# Patient Record
Sex: Female | Born: 1960 | Race: White | Hispanic: No | Marital: Married | Smoking: Former smoker
Health system: Southern US, Community
[De-identification: ages and names within clinical notes are randomized; demographics above are authoritative.]

## PROBLEM LIST (undated history)

## (undated) DIAGNOSIS — K8020 Calculus of gallbladder without cholecystitis without obstruction: Secondary | ICD-10-CM

## (undated) DIAGNOSIS — E7211 Homocystinuria: Secondary | ICD-10-CM

## (undated) DIAGNOSIS — E785 Hyperlipidemia, unspecified: Secondary | ICD-10-CM

## (undated) DIAGNOSIS — R7989 Other specified abnormal findings of blood chemistry: Secondary | ICD-10-CM

## (undated) DIAGNOSIS — M1990 Unspecified osteoarthritis, unspecified site: Secondary | ICD-10-CM

## (undated) DIAGNOSIS — Z87442 Personal history of urinary calculi: Secondary | ICD-10-CM

## (undated) DIAGNOSIS — N209 Urinary calculus, unspecified: Secondary | ICD-10-CM

## (undated) DIAGNOSIS — F419 Anxiety disorder, unspecified: Secondary | ICD-10-CM

## (undated) HISTORY — PX: DILATION AND CURETTAGE OF UTERUS: SHX78

## (undated) HISTORY — DX: Other specified abnormal findings of blood chemistry: R79.89

## (undated) HISTORY — DX: Urinary calculus, unspecified: N20.9

## (undated) HISTORY — PX: APPENDECTOMY: SHX54

## (undated) HISTORY — PX: EXTRACORPOREAL SHOCK WAVE LITHOTRIPSY: SHX1557

## (undated) HISTORY — PX: PELVIC LAPAROSCOPY: SHX162

## (undated) HISTORY — DX: Anxiety disorder, unspecified: F41.9

## (undated) HISTORY — DX: Hyperlipidemia, unspecified: E78.5

## (undated) HISTORY — DX: Homocystinuria: E72.11

---

## 1998-06-01 ENCOUNTER — Other Ambulatory Visit: Admission: RE | Admit: 1998-06-01 | Discharge: 1998-06-01 | Payer: Self-pay | Admitting: Obstetrics and Gynecology

## 1999-03-24 ENCOUNTER — Encounter: Payer: Self-pay | Admitting: Urology

## 1999-03-24 ENCOUNTER — Ambulatory Visit (HOSPITAL_COMMUNITY): Admission: RE | Admit: 1999-03-24 | Discharge: 1999-03-24 | Payer: Self-pay | Admitting: Urology

## 1999-06-06 ENCOUNTER — Other Ambulatory Visit: Admission: RE | Admit: 1999-06-06 | Discharge: 1999-06-06 | Payer: Self-pay | Admitting: Obstetrics and Gynecology

## 2000-07-09 ENCOUNTER — Other Ambulatory Visit: Admission: RE | Admit: 2000-07-09 | Discharge: 2000-07-09 | Payer: Self-pay | Admitting: Obstetrics and Gynecology

## 2001-08-12 ENCOUNTER — Other Ambulatory Visit: Admission: RE | Admit: 2001-08-12 | Discharge: 2001-08-12 | Payer: Self-pay | Admitting: Obstetrics and Gynecology

## 2002-09-08 ENCOUNTER — Other Ambulatory Visit: Admission: RE | Admit: 2002-09-08 | Discharge: 2002-09-08 | Payer: Self-pay | Admitting: Obstetrics and Gynecology

## 2003-09-11 ENCOUNTER — Other Ambulatory Visit: Admission: RE | Admit: 2003-09-11 | Discharge: 2003-09-11 | Payer: Self-pay | Admitting: Obstetrics and Gynecology

## 2004-03-14 ENCOUNTER — Ambulatory Visit: Payer: Self-pay | Admitting: Family Medicine

## 2004-09-27 ENCOUNTER — Ambulatory Visit: Payer: Self-pay | Admitting: Internal Medicine

## 2004-09-30 ENCOUNTER — Encounter: Admission: RE | Admit: 2004-09-30 | Discharge: 2004-09-30 | Payer: Self-pay | Admitting: Internal Medicine

## 2004-10-19 ENCOUNTER — Ambulatory Visit: Payer: Self-pay | Admitting: Internal Medicine

## 2004-10-21 ENCOUNTER — Encounter: Admission: RE | Admit: 2004-10-21 | Discharge: 2004-10-21 | Payer: Self-pay | Admitting: Internal Medicine

## 2004-12-12 ENCOUNTER — Ambulatory Visit: Payer: Self-pay | Admitting: Internal Medicine

## 2004-12-14 ENCOUNTER — Other Ambulatory Visit: Admission: RE | Admit: 2004-12-14 | Discharge: 2004-12-14 | Payer: Self-pay | Admitting: Obstetrics and Gynecology

## 2004-12-23 ENCOUNTER — Encounter: Admission: RE | Admit: 2004-12-23 | Discharge: 2004-12-23 | Payer: Self-pay | Admitting: Obstetrics and Gynecology

## 2004-12-28 ENCOUNTER — Encounter: Admission: RE | Admit: 2004-12-28 | Discharge: 2005-03-28 | Payer: Self-pay | Admitting: Internal Medicine

## 2005-02-01 ENCOUNTER — Ambulatory Visit: Payer: Self-pay | Admitting: Internal Medicine

## 2007-03-19 ENCOUNTER — Ambulatory Visit: Payer: Self-pay | Admitting: Internal Medicine

## 2007-03-22 ENCOUNTER — Telehealth: Payer: Self-pay | Admitting: Internal Medicine

## 2008-04-29 ENCOUNTER — Ambulatory Visit: Payer: Self-pay | Admitting: Internal Medicine

## 2008-04-29 ENCOUNTER — Encounter (INDEPENDENT_AMBULATORY_CARE_PROVIDER_SITE_OTHER): Payer: Self-pay | Admitting: *Deleted

## 2008-04-29 DIAGNOSIS — E7210 Disorders of sulfur-bearing amino-acid metabolism, unspecified: Secondary | ICD-10-CM | POA: Insufficient documentation

## 2008-05-04 ENCOUNTER — Telehealth (INDEPENDENT_AMBULATORY_CARE_PROVIDER_SITE_OTHER): Payer: Self-pay | Admitting: *Deleted

## 2008-05-14 ENCOUNTER — Ambulatory Visit: Payer: Self-pay | Admitting: Internal Medicine

## 2008-05-20 ENCOUNTER — Encounter (INDEPENDENT_AMBULATORY_CARE_PROVIDER_SITE_OTHER): Payer: Self-pay | Admitting: *Deleted

## 2008-06-03 ENCOUNTER — Telehealth (INDEPENDENT_AMBULATORY_CARE_PROVIDER_SITE_OTHER): Payer: Self-pay | Admitting: *Deleted

## 2008-11-10 ENCOUNTER — Ambulatory Visit: Payer: Self-pay | Admitting: Gastroenterology

## 2008-12-01 ENCOUNTER — Ambulatory Visit: Payer: Self-pay | Admitting: Gastroenterology

## 2009-11-19 ENCOUNTER — Ambulatory Visit: Payer: Self-pay | Admitting: Internal Medicine

## 2009-11-19 ENCOUNTER — Encounter: Payer: Self-pay | Admitting: Internal Medicine

## 2009-11-19 DIAGNOSIS — F411 Generalized anxiety disorder: Secondary | ICD-10-CM | POA: Insufficient documentation

## 2009-11-19 DIAGNOSIS — E785 Hyperlipidemia, unspecified: Secondary | ICD-10-CM

## 2009-11-25 LAB — CONVERTED CEMR LAB
ALT: 14 units/L (ref 0–35)
Alkaline Phosphatase: 67 units/L (ref 39–117)
BUN: 13 mg/dL (ref 6–23)
Basophils Absolute: 0 10*3/uL (ref 0.0–0.1)
Basophils Relative: 0.7 % (ref 0.0–3.0)
Bilirubin, Direct: 0.1 mg/dL (ref 0.0–0.3)
Cholesterol: 193 mg/dL (ref 0–200)
Creatinine, Ser: 0.8 mg/dL (ref 0.4–1.2)
Eosinophils Absolute: 0.1 10*3/uL (ref 0.0–0.7)
Eosinophils Relative: 1.6 % (ref 0.0–5.0)
HDL: 64.9 mg/dL (ref >39.00)
Hemoglobin: 13.9 g/dL (ref 12.0–15.0)
LDL Cholesterol: 117 mg/dL — ABNORMAL HIGH (ref 0–99)
Lymphs Abs: 1.9 10*3/uL (ref 0.7–4.0)
MCHC: 34.4 g/dL (ref 30.0–36.0)
Monocytes Absolute: 0.3 10*3/uL (ref 0.1–1.0)
Neutro Abs: 3.8 10*3/uL (ref 1.4–7.7)
RBC: 4.43 M/uL (ref 3.87–5.11)
RDW: 12.6 % (ref 11.5–14.6)
Total CHOL/HDL Ratio: 3
Total Protein: 7.3 g/dL (ref 6.0–8.3)

## 2009-12-15 ENCOUNTER — Encounter: Payer: Self-pay | Admitting: Internal Medicine

## 2009-12-15 ENCOUNTER — Ambulatory Visit: Payer: Self-pay | Admitting: Internal Medicine

## 2010-01-21 ENCOUNTER — Other Ambulatory Visit: Payer: Self-pay | Admitting: Internal Medicine

## 2010-01-21 ENCOUNTER — Ambulatory Visit
Admission: RE | Admit: 2010-01-21 | Discharge: 2010-01-21 | Payer: Self-pay | Source: Home / Self Care | Attending: Internal Medicine | Admitting: Internal Medicine

## 2010-01-21 LAB — ALT: ALT: 19 U/L (ref 0–35)

## 2010-01-21 LAB — AST: AST: 20 U/L (ref 0–37)

## 2010-01-21 LAB — LIPID PANEL
Cholesterol: 170 mg/dL (ref 0–200)
HDL: 72.2 mg/dL (ref >39.00)
LDL Cholesterol: 90 mg/dL (ref 0–99)
Total CHOL/HDL Ratio: 2
Triglycerides: 37 mg/dL (ref 0.0–149.0)
VLDL: 7.4 mg/dL (ref 0.0–40.0)

## 2010-02-13 LAB — CONVERTED CEMR LAB
ALT: 18 U/L
AST: 22 U/L
BUN: 11 mg/dL
Basophils Absolute: 0 K/uL
Basophils Relative: 0.1 %
CO2: 32 meq/L
Calcium: 9.2 mg/dL
Chloride: 101 meq/L
Cholesterol: 257 mg/dL — ABNORMAL HIGH
Creatinine, Ser: 0.7 mg/dL
Direct LDL: 174.5 mg/dL
Eosinophils Absolute: 0.2 K/uL
Eosinophils Relative: 2.2 %
GFR calc non Af Amer: 94.28 mL/min
Glucose, Bld: 77 mg/dL
HCT: 40.6 %
HDL: 74.3 mg/dL
Hemoglobin: 14 g/dL
Lymphocytes Relative: 30.3 %
Lymphs Abs: 2.5 K/uL
MCHC: 34.3 g/dL
MCV: 90.5 fL
Monocytes Absolute: 0.5 K/uL
Monocytes Relative: 6.4 %
Neutro Abs: 5 K/uL
Neutrophils Relative %: 61 %
Pap Smear: NORMAL
Platelets: 243 K/uL
Potassium: 3.8 meq/L
RBC: 4.49 M/uL
RDW: 12 %
Sodium: 140 meq/L
TSH: 1.19 u[IU]/mL
Total CHOL/HDL Ratio: 3
Triglycerides: 70 mg/dL
VLDL: 14 mg/dL
WBC: 8.2 10*3/microliter

## 2010-02-15 NOTE — Assessment & Plan Note (Signed)
Summary: CPX//PH   Vital Signs:  Patient profile:   50 year old female Height:      63.5 inches Weight:      155.25 pounds BMI:     27.17 Pulse rate:   80 / minute Pulse rhythm:   regular BP sitting:   130 / 82  (left arm) Cuff size:   regular  Vitals Entered By: Army Fossa CMA (November 19, 2009 10:43 AM) CC: CPX,fasting Comments c/o anxiety recently [facility] [address]   History of Present Illness:  CPX,fasting she also has the following issues  5 weeks ago she found out about some family problems, very anxious, depressed and unable to sleep since then. At the present time, actually the depression is resolved, she is a still a little anxious and would like something to help her sleep  She also developed chest pain a few weeks ago, on and off, described as pressure in the middle of the chest without radiation, symptoms   last about an hour. No associated nausea or diaphoresis She thinks is  stress, after she had a long conversation with a friend and felt better emotionally, the chest pain has decreased   We prescribe simvastatin for her, she took it for a few days and she just did not like it. Denies myalgias per se    Current Medications (verified): 1)  Prempro 0.45-1.5 Mg  Tabs (Conj Estrog-Medroxyprogest Ace) .Marland Kitchen.. 1qd 2)  Aspir-Low 81 Mg Tbec (Aspirin) .... Once Daily  Allergies (verified): No Known Drug Allergies  Past History:  Past Medical History: increased homocysteine h/o urolithiasis BC--husband's vasectomy, also on HRT since 2008 strong family history heart disease Hyperlipidemia Anxiety  Past Surgical History: Reviewed history from 03/19/2007 and no changes required. Appendectomy D&C pelvic lap (to r/o endometriosis)  Family History: colon CA--GF, Dx age 41  pre cancerous polyps, mom dx age 6 Breast CA--mother (in her 28s) CAD--F  ahd a CABG at age  22; then a redo CABG years later . Sibling-44y/o CABG (he had increase cholesterol,former  smoker). 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 Resp:  Denies cough and wheezing. GI:  Denies bloody stools, diarrhea, nausea, and vomiting. GU:  Denies dysuria, hematuria, and urinary hesitancy. Neuro:  some HAs .  Physical Exam  General:  alert, well-developed, and well-nourished.   Neck:  no thyromegaly.  normal carotid pulses Lungs:  normal respiratory effort, no intercostal retractions, no accessory muscle use, and normal breath sounds.   Heart:  normal rate, regular rhythm, and no murmur.   Abdomen:  soft, non-tender, no distention, no masses, no guarding, and no rigidity.   Extremities:  no lower extremity edema Neurologic:  alert & oriented X3, strength normal in all extremities, and gait normal.   Psych:  Oriented X3, memory intact for recent and remote, normally interactive, good eye contact, and not depressed appearing.  slightly anxious   Impression & Recommendations:  Problem # 1:  HEALTH SCREENING (ICD-V70.0) Td 2006 flu shot  strong FH of CAD ----> see next   cont.  ASA   female care per gynecology  also + FH of colon cancer in a GF  (Dx on his 4s) ----> Cscope normal except for tics,  11-10----->  next 2020 diet and exercise discussed    Orders: Venipuncture (91478) TLB-BMP (Basic Metabolic Panel-BMET) (80048-METABOL) TLB-CBC Platelet - w/Differential (85025-CBCD) TLB-Hepatic/Liver Function Pnl (80076-HEPATIC) TLB-Lipid Panel (80061-LIPID) TLB-TSH (Thyroid Stimulating Hormone) (84443-TSH) TLB-ALT (SGPT) (84460-ALT) TLB-AST (SGOT) (84450-SGOT) Specimen Handling (29562)  Problem # 2:  HYPERLIPIDEMIA (ICD-272.4) based on  her last FLP, she was recommended simvastatin she took it for a few days and  "didn't like it", no myalgias per se Labs Lipitor?  The following medications were removed from the medication list:    Simvastatin 40 Mg Tabs (Simvastatin) .Marland Kitchen... 1 by mouth at bedtime  Labs Reviewed: SGOT: 22 (04/29/2008)   SGPT: 18 (04/29/2008)   HDL:74.30 (04/29/2008)  Chol:257 (04/29/2008)  Trig:70.0 (04/29/2008)  Problem # 3:  ANXIETY (ICD-300.00) counseled , we agreed for now to take something to help her sleep. She will call if feels she needs something else  Her updated medication list for this problem includes:    Alprazolam 0.5 Mg Tabs (Alprazolam) .Marland Kitchen... 1 or 2 by mouth at bedtime as needed for insomnia  Problem # 4:  CHEST PAIN (ICD-786.50) chest pain in a 50 year old pt  with strong family history of heart disease. EKG normal sinus rhythm This is happening in the setting of recent anxiety however I am recommending a stress test. she agreed   Orders: EKG w/ Interpretation (93000) Cardiology Referral (Cardiology)  Complete Medication List: 1)  Prempro 0.45-1.5 Mg Tabs (Conj estrog-medroxyprogest ace) .Marland Kitchen.. 1qd 2)  Aspir-low 81 Mg Tbec (Aspirin) .... Once daily 3)  Alprazolam 0.5 Mg Tabs (Alprazolam) .Marland Kitchen.. 1 or 2 by mouth at bedtime as needed for insomnia  Other Orders: Admin 1st Vaccine (13086) Flu Vaccine 91yrs + (57846) T- * Misc. Laboratory test [phone])  Patient Instructions: 1)  Please schedule a follow-up appointment in 2 months.  Prescriptions: ALPRAZOLAM 0.5 MG TABS (ALPRAZOLAM) 1 or 2 by mouth at bedtime as needed for insomnia  #40 x 0   Entered and Authorized by:   Nolon Rod. Paz MD   Signed by:   Nolon Rod. Paz MD on 11/19/2009   Method used:   Print then Give to Patient   RxID:   [id number]    Orders Added: 1)  Admin 1st Vaccine [90471] 2)  Flu Vaccine 68yrs + [66440] 3)  Venipuncture [34742] 4)  TLB-BMP (Basic Metabolic Panel-BMET) [80048-METABOL] 5)  TLB-CBC Platelet - w/Differential [85025-CBCD] 6)  TLB-Hepatic/Liver Function Pnl  [80076-HEPATIC] 7)  TLB-Lipid Panel [80061-LIPID] 8)  TLB-TSH (Thyroid Stimulating Hormone) [84443-TSH] 9)  TLB-ALT (SGPT) [84460-ALT] 10)  TLB-AST (SGOT) [84450-SGOT] 11)  T- * Misc. Laboratory test [id number] 12)  Specimen Handling [99000] 13)  EKG w/ Interpretation [93000] 14)  Est. Patient Level III [87564] 15)  Cardiology Referral [Cardiology] 16)  Est. Patient age 56-64 [55]     Preventive Care Screening  Mammogram:    Date:  11/05/2009    Results:  normal-per pt   Pap Smear:    Date:  11/05/2009    Results:  normal- per pt  Flu Vaccine Consent Questions     Do you have a history of severe allergic reactions to this vaccine? no    Any prior history of allergic reactions to egg and/or gelatin? no    Do you have a sensitivity to the preservative Thimersol? no    Do you have a past history of Guillan-Barre Syndrome? no    Do you currently have an acute febrile illness? no    Have you ever had a  severe reaction to latex? no    Vaccine information given and explained to patient? yes    Are you currently pregnant? no    Lot Number:[id number]   Exp Date:07/16/2010   Site Given  Left Deltoid IM  Mammogram:    Date:  11/05/2009    Results:  normal-per pt   Pap Smear:    Date:  11/05/2009    Results:  normal- per pt   .lbflu   Risk Factors:  Mammogram History:     Date of Last Mammogram:  11/05/2009    Results:  normal-per pt   PAP Smear History:     Date of Last PAP Smear:  11/05/2009    Results:  normal- per pt

## 2010-02-17 NOTE — Assessment & Plan Note (Signed)
Summary: rto 2 months/cbs   Vital Signs:  Patient profile:   50 year old female Weight:      154.38 pounds Pulse rate:   64 / minute Pulse rhythm:   regular BP sitting:   118 / 78  (left arm) Cuff size:   regular  Vitals Entered By: Army Fossa CMA (January 21, 2010 1:51 PM) CC: 6 week f/u- fasting  Comments [facility] [place]    History of Present Illness: here for followup Based on the last cholesterol panel, she started Lipitor +  compliance, no side effects.  She also had chest pain, had a stress test  Review of systems  denies myalgias No further chest pain No nausea or vomiting She continued to exercise routinely Her diet was no hostility during the holidays but she's getting back on track  Current Medications (verified): 1)  Prempro 0.45-1.5 Mg  Tabs (Conj Estrog-Medroxyprogest Ace) .Marland Kitchen.. 1qd 2)  Aspir-Low 81 Mg Tbec (Aspirin) .... Once Daily 3)  Lipitor 10 Mg Tabs (Atorvastatin Calcium) .Marland Kitchen.. 1 By Mouth Daily. -Labwork in 6 Weeks.  Allergies (verified): No Known Drug Allergies  Past History:  Past Medical History: Reviewed history from 11/19/2009 and no changes required. increased homocysteine h/o urolithiasis BC--husband's vasectomy, also on HRT since 2008 strong family history heart disease Hyperlipidemia Anxiety  Past Surgical History: Reviewed history from 03/19/2007 and no changes required. Appendectomy D&C pelvic lap (to r/o endometriosis)  Social History: Reviewed history from 11/19/2009 and no changes required. 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   Lungs:  normal respiratory effort, no intercostal retractions, no accessory muscle use, and normal breath sounds.   Heart:  normal rate, regular rhythm, and no murmur.     Impression & Recommendations:  Problem # 1:   HYPERLIPIDEMIA (ICD-272.4) all  previous cholesterol panels reviewed with the patient. The rationale behind treating her lipids is  discussed Plan: Labs Continue Lipitor see  instructions Her updated medication list for this problem includes:    Lipitor 10 Mg Tabs (Atorvastatin calcium) .Marland Kitchen... 1 by mouth daily. -labwork in 6 weeks.  Labs Reviewed: SGOT: 17 (11/19/2009)   SGPT: 14 (11/19/2009)  Prior 10 Yr Risk Heart Disease: 2 % (12/15/2009)   HDL:64.90 (11/19/2009), 74.30 (04/29/2008)  LDL:117 (11/19/2009)  Chol:193 (11/19/2009), 257 (04/29/2008)  Trig:57.0 (11/19/2009), 70.0 (04/29/2008)  Orders: Venipuncture (47829) TLB-Lipid Panel (80061-LIPID) TLB-ALT (SGPT) (84460-ALT) TLB-AST (SGOT) (84450-SGOT) Specimen Handling (56213)  Problem # 2:  CHEST PAIN (ICD-786.50) resolved  Complete Medication List: 1)  Prempro 0.45-1.5 Mg Tabs (Conj estrog-medroxyprogest ace) .Marland Kitchen.. 1qd 2)  Aspir-low 81 Mg Tbec (Aspirin) .... Once daily 3)  Lipitor 10 Mg Tabs (Atorvastatin calcium) .Marland Kitchen.. 1 by mouth daily. -labwork in 6 weeks.  Patient Instructions: 1)  came back by 06-2010 for labs only: FLP AST ALT---- dx high cholesterol 2)  schedule a physical exam by after 11-20-10 Prescriptions: LIPITOR 10 MG TABS (ATORVASTATIN CALCIUM) 1 by mouth daily. -LABWORK IN 6 WEEKS.  #90 x 4   Entered and Authorized by:   Nolon Rod. Kuper Rennels MD   Signed by:   Nolon Rod. Robley Matassa MD on 01/21/2010   Method used:   Print then Give to Patient   RxID:   [id number]    Orders Added: 1)  Venipuncture [13244] 2)  TLB-Lipid Panel [80061-LIPID] 3)  TLB-ALT (SGPT) [84460-ALT] 4)  TLB-AST (SGOT) [84450-SGOT] 5)  Specimen Handling [99000] 6)  Est. Patient Level III [54098]

## 2010-08-24 ENCOUNTER — Ambulatory Visit: Payer: Self-pay | Admitting: Internal Medicine

## 2010-09-21 ENCOUNTER — Encounter: Payer: Self-pay | Admitting: Internal Medicine

## 2010-09-22 ENCOUNTER — Ambulatory Visit (INDEPENDENT_AMBULATORY_CARE_PROVIDER_SITE_OTHER): Payer: Managed Care, Other (non HMO) | Admitting: Internal Medicine

## 2010-09-22 ENCOUNTER — Encounter: Payer: Self-pay | Admitting: Internal Medicine

## 2010-09-22 DIAGNOSIS — E785 Hyperlipidemia, unspecified: Secondary | ICD-10-CM

## 2010-09-22 DIAGNOSIS — E7210 Disorders of sulfur-bearing amino-acid metabolism, unspecified: Secondary | ICD-10-CM

## 2010-09-22 DIAGNOSIS — D689 Coagulation defect, unspecified: Secondary | ICD-10-CM | POA: Insufficient documentation

## 2010-09-22 DIAGNOSIS — F411 Generalized anxiety disorder: Secondary | ICD-10-CM

## 2010-09-22 LAB — LIPID PANEL
HDL: 73 mg/dL (ref >39.00)
LDL Cholesterol: 87 mg/dL (ref 0–99)
Total CHOL/HDL Ratio: 2
Triglycerides: 78 mg/dL (ref 0.0–149.0)
VLDL: 15.6 mg/dL (ref 0.0–40.0)

## 2010-09-22 NOTE — Assessment & Plan Note (Signed)
Last level ok On a [facility] Labs

## 2010-09-22 NOTE — Assessment & Plan Note (Signed)
Still anxious, some depression Counseled States does not need meds at this time  Rarely takes [place] (started by another MD)

## 2010-09-22 NOTE — Assessment & Plan Note (Addendum)
+   FH of a coagulopathy, the patient has never had clot but she had a miscarriage.  pt needs further testing: refer to hematology

## 2010-09-22 NOTE — Assessment & Plan Note (Addendum)
See HPI, fatigue related to lipitor? There is other issues that may be causing fatigue thus will observe. rec to call if the improving does not continue, will change statin Labs diet-exercise encouraged. Addendum: She likes to go off Lipitor and see how she does. I recommended to hold Lipitor for 3 weeks then go back to it.

## 2010-09-22 NOTE — Progress Notes (Signed)
  Subjective:    Patient ID: Lynn Odonnell, female    DOB: [birth year], 50 y.o.   MRN: [id number]  HPI Coagulopathy? 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 Patient was recommended  to be further tested. Also reports that her mother had a miscarriage  Hyperlipidemia, good medication compliance. If the patient observation that she feels weaker and fatigued since she started taking Lipitor, she has not been exercising for months. She's not sure if weakness is due to Lipitor or anxiety, see below. anxiety: A lot of stress in her life, some depression  Past Medical History  Diagnosis Date  . Increased homocysteine   . Urolithiasis     history  . Hyperlipidemia   . Anxiety    Past Surgical History  Procedure Date  . Appendectomy   . Dilation and curettage of uterus   . Pelvic laparoscopy     endometriosis    Review of Systems Occasionally insomnia, takes ambien from time to time. No suicidal. Denies any typical myalgias, she does have pain at the left heel from time to time. She involved a number of labs from her mother, they are reviewed, they include a protein C, protein,  PT PCR, PT nucleotide F2, several types of MTHFR     Objective:   Physical Exam  Constitutional: She appears well-developed and well-nourished.  HENT:  Head: Normocephalic and atraumatic.  Cardiovascular: Normal rate, regular rhythm and normal heart sounds.   Pulmonary/Chest: Effort normal and breath sounds normal. No respiratory distress. She has no wheezes. She has no rales.  Musculoskeletal: She exhibits no edema.  Psychiatric:       Slightly anxious, not depressed appearing          Assessment & Plan:  Today , I spent more than 25  min with the patient, >50% of the time counseling about anxiety and discussing her possible coagulopathy

## 2010-09-23 LAB — HOMOCYSTEINE: Homocysteine: 10 umol/L (ref 4.0–15.4)

## 2010-09-29 ENCOUNTER — Telehealth: Payer: Self-pay | Admitting: Internal Medicine

## 2010-09-29 NOTE — Telephone Encounter (Signed)
Patient called to check status of hematology referral from last week---says that her insurance web site shows doctors on [address] which is the closest location for her

## 2010-10-04 ENCOUNTER — Encounter (HOSPITAL_BASED_OUTPATIENT_CLINIC_OR_DEPARTMENT_OTHER): Payer: Managed Care, Other (non HMO) | Admitting: Hematology and Oncology

## 2010-10-04 ENCOUNTER — Other Ambulatory Visit: Payer: Self-pay | Admitting: Hematology and Oncology

## 2010-10-04 DIAGNOSIS — D689 Coagulation defect, unspecified: Secondary | ICD-10-CM

## 2010-10-05 ENCOUNTER — Encounter: Payer: Managed Care, Other (non HMO) | Admitting: Hematology and Oncology

## 2010-10-10 LAB — HYPERCOAGULABLE PANEL, COMPREHENSIVE
AntiThromb III Func: 98 % (ref 76–126)
Anticardiolipin IgA: 7 APL U/mL (ref <22)
Anticardiolipin IgG: 9 GPL U/mL (ref <23)
Anticardiolipin IgM: 4 MPL U/mL (ref <11)
Beta-2-Glycoprotein I IgA: 2 A Units (ref <20)
Beta-2-Glycoprotein I IgM: 8 M Units (ref <20)
PTT Lupus Anticoagulant: 30.9 secs (ref 30.0–45.6)
Protein C Activity: 169 % — ABNORMAL HIGH (ref 75–133)
Protein S Activity: 79 % (ref 69–129)

## 2010-10-12 NOTE — Telephone Encounter (Signed)
Patient was seen on 10-04-10 at the Hematologist.

## 2010-10-13 ENCOUNTER — Encounter (HOSPITAL_BASED_OUTPATIENT_CLINIC_OR_DEPARTMENT_OTHER): Payer: Managed Care, Other (non HMO) | Admitting: Hematology and Oncology

## 2010-10-13 DIAGNOSIS — D689 Coagulation defect, unspecified: Secondary | ICD-10-CM

## 2011-04-11 ENCOUNTER — Ambulatory Visit (INDEPENDENT_AMBULATORY_CARE_PROVIDER_SITE_OTHER): Payer: Managed Care, Other (non HMO) | Admitting: Internal Medicine

## 2011-04-11 VITALS — BP 116/82 | HR 103 | Temp 97.9°F | Wt 162.0 lb

## 2011-04-11 DIAGNOSIS — H6690 Otitis media, unspecified, unspecified ear: Secondary | ICD-10-CM

## 2011-04-11 DIAGNOSIS — J329 Chronic sinusitis, unspecified: Secondary | ICD-10-CM

## 2011-04-11 MED ORDER — AMOXICILLIN-POT CLAVULANATE 875-125 MG PO TABS: 1.0000 | ORAL_TABLET | Freq: Two times a day (BID) | ORAL | Status: AC

## 2011-04-11 MED ORDER — ZOLPIDEM TARTRATE 10 MG PO TABS: 10.0000 mg | ORAL_TABLET | Freq: Every evening | ORAL | Status: DC | PRN

## 2011-04-11 MED ORDER — HYDROCODONE-HOMATROPINE 5-1.5 MG/5ML PO SYRP: 5.0000 mL | ORAL_SOLUTION | Freq: Four times a day (QID) | ORAL | Status: AC | PRN

## 2011-04-11 MED ORDER — AZELASTINE HCL 0.1 % NA SOLN: 2.0000 | Freq: Two times a day (BID) | NASAL | Status: DC

## 2011-04-11 NOTE — Patient Instructions (Addendum)
Rest, fluids , tylenol For cough, take Mucinex DM twice a day as needed  If cough is severe, use hydrocodone, will cause drowsiness For congestion use astelin nasal spray until better Take the antibiotic as prescribed ----> Augmentiun Call if no better in few days Call anytime if the symptoms are severe, you have high fever, short of breath, persistent chest pain --- lipitor every day! Schedule a routine office visit once you are better

## 2011-04-11 NOTE — Progress Notes (Signed)
  Subjective:    Patient ID: Lynn Odonnell, female    DOB: 1960/09/15, 51 y.o.   MRN: [id number]  HPI Acute visit Symptoms started about 3 weeks ago when she was visiting [place], symptoms increased a few days after she came back to the [place]. She has head and chest congestion, sore throat, dry cough, green and yellow mucus discharge. Tried nyquil  Past Medical History  Diagnosis Date  . Increased homocysteine   . Urolithiasis     history  . Hyperlipidemia   . Anxiety       Review of Systems No fever chills some shortness of breath mostly from being unable to breathe through her nose She admits to a ill-defined anterior chest pressure on and off, no worse with cough, only for the last few days, no pain today     Objective:   Physical Exam  General -- alert, well-developed, and well-nourished. NAD  HEENT -- TM R normal, L is red, slt gulge, no d/c;  throat w/o redness, face symmetric and tender at the L maxillary area, nose quite congested Lungs -- normal respiratory effort, no intercostal retractions, no accessory muscle use, and few ronchi w/ cough.   Heart-- normal rate, regular rhythm, no murmur, and no gallop.    Extremities-- no pretibial edema bilaterally , calves symmetric and no tender        Assessment & Plan:   3 weeks' history of respiratory symptoms, findings consistent with sinusitis and left otitis media. I'm  somehow concerned about the chest discomfort because she has a family history of clots and just came back from [place] however in light of her  frequent cough, lack of lower extremity edema or shortness of breath,  I think is ok to treat her respiratory symptoms and observe the chest pain; further eval if no better

## 2011-04-12 ENCOUNTER — Encounter: Payer: Self-pay | Admitting: Internal Medicine

## 2011-08-01 ENCOUNTER — Other Ambulatory Visit: Payer: Self-pay | Admitting: Internal Medicine

## 2011-08-01 MED ORDER — ATORVASTATIN CALCIUM 10 MG PO TABS: 10.0000 mg | ORAL_TABLET | Freq: Every day | ORAL | Status: DC

## 2011-08-01 NOTE — Telephone Encounter (Signed)
refill Atorvastatin (Tab) 10 MG Take 10 mg by mouth daily.   #90 Last fill 11.26.12 Last ov 3.26.13

## 2011-08-01 NOTE — Telephone Encounter (Signed)
Rx sent 

## 2011-11-29 ENCOUNTER — Ambulatory Visit (INDEPENDENT_AMBULATORY_CARE_PROVIDER_SITE_OTHER): Payer: Managed Care, Other (non HMO) | Admitting: Internal Medicine

## 2011-11-29 ENCOUNTER — Encounter: Payer: Self-pay | Admitting: Internal Medicine

## 2011-11-29 VITALS — BP 112/72 | HR 65 | Temp 97.6°F | Wt 166.0 lb

## 2011-11-29 DIAGNOSIS — R0789 Other chest pain: Secondary | ICD-10-CM | POA: Insufficient documentation

## 2011-11-29 DIAGNOSIS — E7210 Disorders of sulfur-bearing amino-acid metabolism, unspecified: Secondary | ICD-10-CM

## 2011-11-29 DIAGNOSIS — E785 Hyperlipidemia, unspecified: Secondary | ICD-10-CM

## 2011-11-29 DIAGNOSIS — R079 Chest pain, unspecified: Secondary | ICD-10-CM

## 2011-11-29 LAB — HOMOCYSTEINE: Homocysteine: 7.9 umol/L (ref 4.0–15.4)

## 2011-11-29 MED ORDER — FOLIC ACID 1 MG PO TABS: 1.0000 mg | ORAL_TABLET | Freq: Every day | ORAL | Status: AC

## 2011-11-29 NOTE — Patient Instructions (Addendum)
Please get your x-ray at the other [facility]  office located at: [address] [place], across from [facility].  Please go to the basement, this is a walk-in facility, they are open from 8:30 to 5:30 PM. Phone number [phone]. ----- Start aspirin every day Change folic acid to 1 g daily, see prescription ER if chest pain severe, shortness of breath or increased palpitations Office visit in 4 weeks

## 2011-11-29 NOTE — Progress Notes (Signed)
  Subjective:    Patient ID: Lynn Odonnell, female    DOB: 12-Mar-1960, 51 y.o.   MRN: [id number]  HPI Acute visit She woke up this morning at 2 AM, she had palpitations described as her heart going very fast, she has some discomfort (ill-defined, "no pain") at the anterior chest, some radiation to the back, no radiation to the neck or shoulder. The palpitation gradually decreased although she couldn't say for how long it lasted. The  discomfort also decreased, she still has a very mild discomfort, Initially was 6/10. At the time she woke up , was very worried and become anxious, she was sweaty and slightly clammy. She has a long h/o difficulty sleeping, often times wakes up at 2 AM but no previous palpitations.  Past Medical History  Diagnosis Date  . Increased homocysteine   . Urolithiasis     history  . Hyperlipidemia   . Anxiety    Past Surgical History  Procedure Date  . Appendectomy   . Dilation and curettage of uterus   . Pelvic laparoscopy     endometriosis   History   Social History  . Marital Status: Married    Spouse Name: N/A    Number of Children: 2  . Years of Education: N/A   Occupational History  . nanny    Social History Main Topics  . Smoking status: Former Games developer  . Smokeless tobacco: Never Used     Comment: smoke x 2 years in the 80s  . Alcohol Use: Yes     Comment: rarely   . Drug Use: No  . Sexually Active: Not on file   Other Topics Concern  . Not on file   Social History Narrative       Family History  Problem Relation Age of Onset  . Cancer Mother 55    pre cancerous polyps, breast cancer 60's  . Heart disease Father     CABG, 2 y/o  . Heart disease Brother     CABG, 76 y/o  . Heart disease Brother     stents   . Hyperlipidemia Other   . Breast cancer Mother   . CAD Mother     MI at age , age 39  . Colon cancer      GF   Review of Systems No fever or chills No cough or shortness of breath No nausea, vomiting,  heartburn. No recent pain, leg swelling or leg pain. She is supposed to be on Lipitor, she has been taking it inconsistently, approximately 4 times a week.     Objective:   Physical Exam  General -- alert, well-developed  Neck --no thyromegaly   HEENT -- not pale  Lungs -- normal respiratory effort, no intercostal retractions, no accessory muscle use, and normal breath sounds.   Heart-- normal rate, regular rhythm, no murmur, and no gallop.  Anterior chest wall is nontender to palpation Abdomen--soft, non-tender, no distention, no masses, no HSM, no guarding, and no rigidity.   Extremities-- no pretibial edema bilaterally; right calf not tender but is a slightly larger than the left (half inch)  Psych-- Cognition and judgment appear intact. Alert and cooperative with normal attention span and concentration.  not anxious appearing and not depressed appearing.       Assessment & Plan:

## 2011-11-29 NOTE — Assessment & Plan Note (Signed)
Due to cost, is taking Lipitor 4 times a week, recommend to increase it to once daily, check LFTs

## 2011-11-29 NOTE — Assessment & Plan Note (Addendum)
51 year old female with history of high cholesterol, history of increased homocysteine levels on a low dose of folic acid, strong family history of heart disease. Presents with a history of palpitations and chest pain, pain isn't typical, EKG is at baseline. Exam is benign except for a slightly large right calf. Reports and negative stress test in 2011, nor reports found Plan: Chest x-ray Labs D-dimer, if positive will do ultrasound of the right lower extremity and consider a CT chest Stress test, requests no treadmill ER if symptoms increase.

## 2011-11-29 NOTE — Assessment & Plan Note (Signed)
Currently on folic acid 400 mg daily, Increase folic acid to 1 g daily

## 2011-11-30 ENCOUNTER — Telehealth: Payer: Self-pay | Admitting: Internal Medicine

## 2011-11-30 ENCOUNTER — Encounter: Payer: Self-pay | Admitting: Internal Medicine

## 2011-11-30 ENCOUNTER — Encounter: Payer: Self-pay | Admitting: *Deleted

## 2011-11-30 LAB — CBC WITH DIFFERENTIAL/PLATELET
Eosinophils Absolute: 0.1 10*3/uL (ref 0.0–0.7)
Eosinophils Relative: 1.4 % (ref 0.0–5.0)
HCT: 41 % (ref 36.0–46.0)
Hemoglobin: 13.5 g/dL (ref 12.0–15.0)
MCHC: 32.9 g/dL (ref 30.0–36.0)
MCV: 91.6 fl (ref 78.0–100.0)
Monocytes Absolute: 0.4 10*3/uL (ref 0.1–1.0)
Monocytes Relative: 5.6 % (ref 3.0–12.0)
Neutro Abs: 5.2 10*3/uL (ref 1.4–7.7)
RDW: 12.4 % (ref 11.5–14.6)
WBC: 7.7 10*3/uL (ref 4.5–10.5)

## 2011-11-30 LAB — COMPREHENSIVE METABOLIC PANEL
ALT: 13 U/L (ref 0–35)
Calcium: 9.4 mg/dL (ref 8.4–10.5)
GFR: 76.37 mL/min (ref >60.00)
Glucose, Bld: 91 mg/dL (ref 70–99)
Potassium: 3.8 mEq/L (ref 3.5–5.1)
Total Bilirubin: 0.6 mg/dL (ref 0.3–1.2)
Total Protein: 7.9 g/dL (ref 6.0–8.3)

## 2011-11-30 NOTE — Telephone Encounter (Signed)
Tests negative, waiting for the other results

## 2011-11-30 NOTE — Telephone Encounter (Signed)
[facility] Fax: [phone] From: [facility] Date/ Time: 11/29/2011 5:46 PM Taken By: Reita Cliche, CSR Caller: Delaney Meigs Facility: [facility] Patient: Lynn Odonnell, Lynn Odonnell DOB: [birth year] Phone: [phone] Reason for Call: Delaney Meigs is calling from [place] regarding a D-dimer and Homocysteine ordered on Donna Bernard by Willow Ora MD. Labs drawn on 11/29/2011 at 2:38:00 PM. The results were Ddimer: 0.30 ; Homocysteine: 7.9. Regarding Appointment: Appt Date: Appt Time: Unk

## 2011-12-01 ENCOUNTER — Ambulatory Visit (INDEPENDENT_AMBULATORY_CARE_PROVIDER_SITE_OTHER)
Admission: RE | Admit: 2011-12-01 | Discharge: 2011-12-01 | Disposition: A | Discharge status: Home / Self Care | Payer: Managed Care, Other (non HMO) | Source: Ambulatory Visit | Attending: Internal Medicine | Admitting: Internal Medicine

## 2011-12-01 DIAGNOSIS — R079 Chest pain, unspecified: Secondary | ICD-10-CM

## 2011-12-13 ENCOUNTER — Ambulatory Visit (HOSPITAL_COMMUNITY): Payer: Managed Care, Other (non HMO) | Attending: Cardiovascular Disease | Admitting: Radiology

## 2011-12-13 VITALS — BP 106/73 | HR 56 | Ht 63.5 in | Wt 159.0 lb

## 2011-12-13 DIAGNOSIS — R61 Generalized hyperhidrosis: Secondary | ICD-10-CM | POA: Insufficient documentation

## 2011-12-13 DIAGNOSIS — R002 Palpitations: Secondary | ICD-10-CM | POA: Insufficient documentation

## 2011-12-13 DIAGNOSIS — R079 Chest pain, unspecified: Secondary | ICD-10-CM

## 2011-12-13 DIAGNOSIS — R0789 Other chest pain: Secondary | ICD-10-CM | POA: Insufficient documentation

## 2011-12-13 DIAGNOSIS — Z8249 Family history of ischemic heart disease and other diseases of the circulatory system: Secondary | ICD-10-CM | POA: Insufficient documentation

## 2011-12-13 DIAGNOSIS — R000 Tachycardia, unspecified: Secondary | ICD-10-CM | POA: Insufficient documentation

## 2011-12-13 MED ORDER — REGADENOSON 0.4 MG/5ML IV SOLN
0.4000 mg | Freq: Once | INTRAVENOUS | Status: AC
  Administered 2011-12-13: 0.4 mg via INTRAVENOUS

## 2011-12-13 MED ORDER — TECHNETIUM TC 99M SESTAMIBI GENERIC - CARDIOLITE
9.7000 | Freq: Once | INTRAVENOUS | Status: AC | PRN
  Administered 2011-12-13: 10 via INTRAVENOUS

## 2011-12-13 MED ORDER — TECHNETIUM TC 99M SESTAMIBI GENERIC - CARDIOLITE
31.6000 | Freq: Once | INTRAVENOUS | Status: AC | PRN
  Administered 2011-12-13: 32 via INTRAVENOUS

## 2011-12-13 NOTE — Progress Notes (Signed)
[facility] SITE 3 NUCLEAR MED [address] [id number] [place] [address] [phone]  Cardiology Nuclear Med Study  Lynn Odonnell is a 51 y.o. female     MRN : [id number]     DOB: Jun 27, 1960  Procedure Date: 12/13/2011  Nuclear Med Background Indication for Stress Test:  Evaluation for Ischemia History:  '11 [id number] Cardiac Risk Factors: Family History - CAD, History of Smoking and Lipids  Symptoms:  Chest Pressure/Tightness (last episode of chest discomfort was yesterday), Diaphoresis, Palpitations and Rapid HR   Nuclear Pre-Procedure Caffeine/Decaff Intake:  None> 12 hrs NPO After: 6:30pm   Lungs:  Clear. O2 Sat: 98% on room air. IV 0.9% NS with Angio Cath:  20g  IV Site: R Antecubital x 1, tolerated well IV Started by:  Lynn Hong, RN  Chest Size (in):  36 Cup Size: B  Height: 5' 3.5" (1.613 m)  Weight:  159 lb (72.122 kg)  BMI:  Body mass index is 27.72 kg/(m^2). Tech Comments:  n/a    Nuclear Med Study 1 or 2 day study: 1 day  Stress Test Type:  Treadmill/Lexiscan  Reading MD: Lynn Miss, MD  Order Authorizing Provider:  Willow Ora, MD  Resting Radionuclide: Technetium 43m Sestamibi  Resting Radionuclide Dose: 9.7 mCi   Stress Radionuclide:  Technetium 27m Sestamibi  Stress Radionuclide Dose: 31.6 mCi            Stress Protocol Rest HR: 56 Stress HR: 131  Rest BP: 106/73 Stress BP: 118/62  Exercise Time (min): 2:00 METS: n/a   Predicted Max HR: 167 bpm % Max HR: 78.44 bpm Rate Pressure Product: 44034   Dose of Adenosine (mg):  n/a Dose of Lexiscan: 0.4 mg  Dose of Atropine (mg): n/a Dose of Dobutamine: n/a mcg/kg/min (at max HR)  Stress Test Technologist: Lynn Odonnell, CMA-N  Nuclear Technologist:  Lynn Odonnell, CNMT     Rest Procedure:  Myocardial perfusion imaging was performed at rest 45 minutes following the intravenous administration of Technetium 64m Sestamibi.  Rest ECG: No acute changes.  Stress Procedure:   The patient received IV Lexiscan 0.4 mg over 15-seconds with concurrent low level exercise and then Technetium 25m Sestamibi was injected at 30-seconds while the patient continued walking one more minute. There were nonspecific T-wave changes with Lexiscan. Quantitative spect images were obtained after a 45-minute delay.  Stress ECG: No significant change from baseline ECG  QPS Raw Data Images:  Normal; no motion artifact; normal heart/lung ratio. Stress Images:  There is mild apical thinning with normal uptake in other regions. Rest Images:  There is mild apical thinning with normal uptake in other regions. Subtraction (SDS):  No evidence of ischemia. Transient Ischemic Dilatation (Normal <1.22):  1.02 Lung/Heart Ratio (Normal <0.45):  0.22  Quantitative Gated Spect Images QGS EDV:  85 ml QGS ESV:  40 ml  Impression Exercise Capacity:  Lexiscan with low level exercise. BP Response:  Normal blood pressure response. Clinical Symptoms:  No significant symptoms noted. ECG Impression:  No significant ST segment change suggestive of ischemia. Comparison with Prior Nuclear Study: No previous nuclear study performed  Overall Impression:  Low risk stress nuclear study.  There is mild apical thinning but no evidence of ischemia.    LV Ejection Fraction: 53%.  LV Wall Motion:  NL LV Function; NL Wall Motion.    Lynn Odonnell, Lynn Odonnell., MD, [facility] 12/13/2011, 5:51 PM Office - [phone] Pager [phone]

## 2011-12-15 ENCOUNTER — Telehealth: Payer: Self-pay | Admitting: Internal Medicine

## 2011-12-15 NOTE — Telephone Encounter (Signed)
Left detailed msg on pt's vmail.  

## 2011-12-15 NOTE — Telephone Encounter (Signed)
Advise patient, stress test was negative, if she continue to have chest pains, let me know.

## 2011-12-29 ENCOUNTER — Telehealth: Payer: Self-pay | Admitting: *Deleted

## 2011-12-29 NOTE — Telephone Encounter (Signed)
Pt states that she just received a 6000 bill for stress test. Pt would like to know why Dr Drue Novel order this test.  Pt is aware he is out of office will await his return.Please advise   Pt also request copy of test, Letter Mail

## 2011-12-30 NOTE — Telephone Encounter (Signed)
She had chest pain in the setting of a strong family history of heart disease, she also has high cholesterol. I am sorry that she got such a large bill however without doing the stress test, it could not tell if the pain was related to her heart or not. If I need to talk with  her insurance or send a letter to get better coverage for the test, I will be happy to do so.

## 2012-01-01 NOTE — Telephone Encounter (Signed)
She states that she will wait to see how much of it her insurance will cover.

## 2012-01-01 NOTE — Telephone Encounter (Signed)
Discussed with pt

## 2012-01-03 ENCOUNTER — Other Ambulatory Visit: Payer: Self-pay | Admitting: Internal Medicine

## 2012-01-03 NOTE — Telephone Encounter (Signed)
Refill done.  

## 2012-03-27 ENCOUNTER — Telehealth: Payer: Self-pay | Admitting: Internal Medicine

## 2012-03-27 NOTE — Telephone Encounter (Signed)
Noted  

## 2012-03-27 NOTE — Telephone Encounter (Signed)
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  Beginning to feel cold symptoms with nasal congestion and mild sore throat.  Asking if OK to use otc combination cold medicatine while taking Aleve BID for tendonitis.  Advised not to use otc product with Acetaminophen, Dextromethorphan, phenylephrine.  Hydrate and humidify.  May use Guaifenesin prn to loosen mucus.  May use Tylenol BID at least 6 hrs after Aleve but use it sparingly.  Symptoms  Reason For Call & Symptoms: Asking if OK to take otc Top Care generic Dayquil/Nyquil while taking Aleve daily for tendonitis. Mild sore throat and cold symptoms began 03/26/12.  Reviewed Health History In EMR: Yes  Reviewed Medications In EMR: Yes  Reviewed Allergies In EMR: Yes  Reviewed Surgeries / Procedures: Yes  Date of Onset of Symptoms: 03/26/2012 OB / GYN:  LMP: Unknown  Guideline(s) Used:  Colds  Disposition Per Guideline:   Home Care  Reason For Disposition Reached:   Colds with no complications  Advice Given:  For a Runny Nose With Profuse Discharge:   Nasal mucus and discharge helps to wash viruses and bacteria out of the nose and sinuses.  Blowing the nose is all that is needed.  Expected Course:   Nasal discharge 7-14 days  Call Back If:  You become worse  Medicines for a Stuffy or Runny Nose:  Most cold medicines that are available over-the-counter (OTC) are not helpful.

## 2012-10-30 ENCOUNTER — Telehealth: Payer: Self-pay | Admitting: *Deleted

## 2012-10-30 NOTE — Telephone Encounter (Signed)
Patient called and asked when her last tetanus shot was. She was given this information.

## 2013-04-30 ENCOUNTER — Other Ambulatory Visit: Payer: Self-pay | Admitting: Internal Medicine

## 2013-04-30 NOTE — Telephone Encounter (Signed)
Rx sent to the pharmacy by e-script.  Pt needs complete physical and fasting labs.//AB/CMA 

## 2013-06-11 ENCOUNTER — Other Ambulatory Visit: Payer: Self-pay | Admitting: Internal Medicine

## 2013-07-21 ENCOUNTER — Other Ambulatory Visit: Payer: Self-pay | Admitting: Internal Medicine

## 2016-01-12 NOTE — H&P (Signed)
TOTAL HIP ADMISSION H&P  Patient is admitted for right total hip arthroplasty, anterior approach.  Subjective:  Chief Complaint: Right hip primary OA / pain  HPI: Sharmaine BaseJessica L Wiemann, 55 y.o. female, has a history of pain and functional disability in the right hip(s) due to arthritis and patient has failed non-surgical conservative treatments for greater than 12 weeks to include NSAID's and/or analgesics, corticosteriod injections and activity modification.  Onset of symptoms was gradual starting 1+ years ago with gradually worsening course since that time.The patient noted no past surgery on the right hip(s).  Patient currently rates pain in the right hip at 6 out of 10 with activity. Patient has night pain, worsening of pain with activity and weight bearing, trendelenberg gait, pain that interfers with activities of daily living and pain with passive range of motion. Patient has evidence of periarticular osteophytes and joint space narrowing by imaging studies. This condition presents safety issues increasing the risk of falls. There is no current active infection.   Risks, benefits and expectations were discussed with the patient.  Risks including but not limited to the risk of anesthesia, blood clots, nerve damage, blood vessel damage, failure of the prosthesis, infection and up to and including death.  Patient understand the risks, benefits and expectations and wishes to proceed with surgery.   PCP: Willow OraJose Paz, MD  D/C Plans:      Home  Post-op Meds:       No Rx given  Tranexamic Acid:      To be given - IV   Decadron:      Is to be given  FYI:     ASA  Norco    Patient Active Problem List   Diagnosis Date Noted  . Chest pain, atypical 11/29/2011  . Coagulopathy-- saw hematology 09-2010, w/u (-) except for heterozygite for MTHFR 09/22/2010  . HYPERLIPIDEMIA 11/19/2009  . ANXIETY 11/19/2009  . HOMOCYSTINEMIA 04/29/2008   Past Medical History:  Diagnosis Date  . Anxiety   .  Hyperlipidemia   . Increased homocysteine   . Urolithiasis    history    Past Surgical History:  Procedure Laterality Date  . APPENDECTOMY    . DILATION AND CURETTAGE OF UTERUS    . PELVIC LAPAROSCOPY     endometriosis    No prescriptions prior to admission.   No Known Allergies   Social History  Substance Use Topics  . Smoking status: Former Games developermoker  . Smokeless tobacco: Never Used     Comment: smoke x 2 years in the 80s  . Alcohol use Yes     Comment: rarely     Family History  Problem Relation Age of Onset  . Cancer Mother [id number]    pre cancerous polyps, breast cancer 60's  . Heart disease Father     CABG, 57 y/o  . Heart disease Brother     CABG, 57 y/o  . Heart disease Brother     stents   . Hyperlipidemia Other   . Breast cancer Mother   . CAD Mother     MI at age , age 57  . Colon cancer      GF     Review of Systems  Constitutional: Negative.   HENT: Negative.   Eyes: Negative.   Respiratory: Negative.   Cardiovascular: Negative.   Gastrointestinal: Negative.   Genitourinary: Negative.   Musculoskeletal: Positive for joint pain.  Skin: Negative.   Neurological: Negative.   Endo/Heme/Allergies: Negative.   Psychiatric/Behavioral: The  patient is nervous/anxious.     Objective:  Physical Exam  Constitutional: She is oriented to person, place, and time. She appears well-developed.  HENT:  Head: Normocephalic.  Eyes: Pupils are equal, round, and reactive to light.  Neck: Neck supple. No JVD present. No tracheal deviation present. No thyromegaly present.  Cardiovascular: Normal rate, regular rhythm, normal heart sounds and intact distal pulses.   Respiratory: Effort normal and breath sounds normal. No respiratory distress. She has no wheezes.  GI: Soft. There is no tenderness. There is no guarding.  Musculoskeletal:       Right knee: She exhibits decreased range of motion, swelling and bony tenderness. She exhibits no ecchymosis, no deformity, no  laceration and no erythema. Tenderness found.  Lymphadenopathy:    She has no cervical adenopathy.  Neurological: She is alert and oriented to person, place, and time.  Skin: Skin is warm and dry.  Psychiatric: She has a normal mood and affect.      Labs:  Estimated body mass index is 27.72 kg/m as calculated from the following:   Height as of 12/13/11: 5' 3.5" (1.613 m).   Weight as of 12/13/11: 72.1 kg (159 lb).   Imaging Review Plain radiographs demonstrate severe degenerative joint disease of the right hip(s). The bone quality appears to be good for age and reported activity level.  Assessment/Plan:  End stage arthritis, right hip(s)  The patient history, physical examination, clinical judgement of the provider and imaging studies are consistent with end stage degenerative joint disease of the right hip(s) and total hip arthroplasty is deemed medically necessary. The treatment options including medical management, injection therapy, arthroscopy and arthroplasty were discussed at length. The risks and benefits of total hip arthroplasty were presented and reviewed. The risks due to aseptic loosening, infection, stiffness, dislocation/subluxation,  thromboembolic complications and other imponderables were discussed.  The patient acknowledged the explanation, agreed to proceed with the plan and consent was signed. Patient is being admitted for inpatient treatment for surgery, pain control, PT, OT, prophylactic antibiotics, VTE prophylaxis, progressive ambulation and ADL's and discharge planning.The patient is planning to be discharged home.     Anastasio AuerbachMatthew S. Laiken Nohr   PA-C  01/12/2016, 1:12 PM

## 2016-01-18 NOTE — Patient Instructions (Addendum)
Sharmaine BaseJessica L Ishee  01/18/2016   Your procedure is scheduled on: 01-25-16  [facility]  [facility] to 3rd floor to  Short Stay Center at  0530 AM.  Call this number if you have problems the morning of surgery [phone]   Remember: ONLY 1 PERSON MAY GO WITH YOU TO SHORT STAY TO GET  READY MORNING OF YOUR SURGERY.  Do not eat food or drink liquids :After Midnight.     Take these medicines the morning of surgery with A SIP OF WATER: NONE. DO NOT TAKE ANY DIABETIC MEDICATIONS DAY OF YOUR SURGERY                               You may not have any metal on your body including hair pins and              piercings  Do not wear jewelry, make-up, lotions, powders or perfumes, deodorant             Do not wear nail polish.  Do not shave  48 hours prior to surgery.              Men may shave face and neck.   Do not bring valuables to the hospital. [facility] IS NOT             RESPONSIBLE   FOR VALUABLES.  Contacts, dentures or bridgework may not be worn into surgery.  Leave suitcase in the car. After surgery it may be brought to your room.     Patients discharged the day of surgery will not be allowed to drive home.  Name and phone number of your driver: Tinnie GensJeffrey spouse [phone] cell  Special Instructions: N/A              Please read over the following fact sheets you were given: _____________________________________________________________________             [facility] - Preparing for Surgery Before surgery, you can play an important role.  Because skin is not sterile, your skin needs to be as free of germs as possible.  You can reduce the number of germs on your skin by washing with CHG (chlorahexidine gluconate) soap before surgery.  CHG is an antiseptic cleaner which kills germs and bonds with the skin to continue killing germs even after washing. Please DO NOT use if you have an allergy to CHG or antibacterial soaps.  If  your skin becomes reddened/irritated stop using the CHG and inform your nurse when you arrive at Short Stay. Do not shave (including legs and underarms) for at least 48 hours prior to the first CHG shower.  You may shave your face/neck. Please follow these instructions carefully:  1.  Shower with CHG Soap the night before surgery and the  morning of Surgery.  2.  If you choose to wash your hair, wash your hair first as usual with your  normal  shampoo.  3.  After you shampoo, rinse your hair and body thoroughly to remove the  shampoo.                           4.  Use CHG as you would any other liquid soap.  You can apply chg directly  to the  skin and wash                       Gently with a scrungie or clean washcloth.  5.  Apply the CHG Soap to your body ONLY FROM THE NECK DOWN.   Do not use on face/ open                           Wound or open sores. Avoid contact with eyes, ears mouth and genitals (private parts).                       Wash face,  Genitals (private parts) with your normal soap.             6.  Wash thoroughly, paying special attention to the area where your surgery  will be performed.  7.  Thoroughly rinse your body with warm water from the neck down.  8.  DO NOT shower/wash with your normal soap after using and rinsing off  the CHG Soap.                9.  Pat yourself dry with a clean towel.            10.  Wear clean pajamas.            11.  Place clean sheets on your bed the night of your first shower and do not  sleep with pets. Day of Surgery : Do not apply any lotions/deodorants the morning of surgery.  Please wear clean clothes to the hospital/surgery center.  FAILURE TO FOLLOW THESE INSTRUCTIONS MAY RESULT IN THE CANCELLATION OF YOUR SURGERY PATIENT SIGNATURE_________________________________  NURSE SIGNATURE__________________________________  ________________________________________________________________________   Adam Phenix  An incentive  spirometer is a tool that can help keep your lungs clear and active. This tool measures how well you are filling your lungs with each breath. Taking long deep breaths may help reverse or decrease the chance of developing breathing (pulmonary) problems (especially infection) following:  A long period of time when you are unable to move or be active. BEFORE THE PROCEDURE   If the spirometer includes an indicator to show your best effort, your nurse or respiratory therapist will set it to a desired goal.  If possible, sit up straight or lean slightly forward. Try not to slouch.  Hold the incentive spirometer in an upright position. INSTRUCTIONS FOR USE  1. Sit on the edge of your bed if possible, or sit up as far as you can in bed or on a chair. 2. Hold the incentive spirometer in an upright position. 3. Breathe out normally. 4. Place the mouthpiece in your mouth and seal your lips tightly around it. 5. Breathe in slowly and as deeply as possible, raising the piston or the ball toward the top of the column. 6. Hold your breath for 3-5 seconds or for as long as possible. Allow the piston or ball to fall to the bottom of the column. 7. Remove the mouthpiece from your mouth and breathe out normally. 8. Rest for a few seconds and repeat Steps 1 through 7 at least 10 times every 1-2 hours when you are awake. Take your time and take a few normal breaths between deep breaths. 9. The spirometer may include an indicator to show your best effort. Use the indicator as a goal to work toward during each repetition. 10. After each set of  10 deep breaths, practice coughing to be sure your lungs are clear. If you have an incision (the cut made at the time of surgery), support your incision when coughing by placing a pillow or rolled up towels firmly against it. Once you are able to get out of bed, walk around indoors and cough well. You may stop using the incentive spirometer when instructed by your caregiver.   RISKS AND COMPLICATIONS  Take your time so you do not get dizzy or light-headed.  If you are in pain, you may need to take or ask for pain medication before doing incentive spirometry. It is harder to take a deep breath if you are having pain. AFTER USE  Rest and breathe slowly and easily.  It can be helpful to keep track of a log of your progress. Your caregiver can provide you with a simple table to help with this. If you are using the spirometer at home, follow these instructions: [place] IF:   You are having difficultly using the spirometer.  You have trouble using the spirometer as often as instructed.  Your pain medication is not giving enough relief while using the spirometer.  You develop fever of 100.5 F (38.1 C) or higher. SEEK IMMEDIATE MEDICAL CARE IF:   You cough up bloody sputum that had not been present before.  You develop fever of 102 F (38.9 C) or greater.  You develop worsening pain at or near the incision site. MAKE SURE YOU:   Understand these instructions.  Will watch your condition.  Will get help right away if you are not doing well or get worse. Document Released: 05/15/2006 Document Revised: 03/27/2011 Document Reviewed: 07/16/2006 Exit[employer] Patient Information 2014 [employer], [place].   ________________________________________________________________________  WHAT IS A BLOOD TRANSFUSION? Blood Transfusion Information  A transfusion is the replacement of blood or some of its parts. Blood is made up of multiple cells which provide different functions.  Red blood cells carry oxygen and are used for blood loss replacement.  White blood cells fight against infection.  Platelets control bleeding.  Plasma helps clot blood.  Other blood products are available for specialized needs, such as hemophilia or other clotting disorders. BEFORE THE TRANSFUSION  Who gives blood for transfusions?   Healthy volunteers who are fully evaluated  to make sure their blood is safe. This is blood bank blood. Transfusion therapy is the safest it has ever been in the practice of medicine. Before blood is taken from a donor, a complete history is taken to make sure that person has no history of diseases nor engages in risky social behavior (examples are intravenous drug use or sexual activity with multiple partners). The donor's travel history is screened to minimize risk of transmitting infections, such as malaria. The donated blood is tested for signs of infectious diseases, such as HIV and hepatitis. The blood is then tested to be sure it is compatible with you in order to minimize the chance of a transfusion reaction. If you or a relative donates blood, this is often done in anticipation of surgery and is not appropriate for emergency situations. It takes many days to process the donated blood. RISKS AND COMPLICATIONS Although transfusion therapy is very safe and saves many lives, the main dangers of transfusion include:   Getting an infectious disease.  Developing a transfusion reaction. This is an allergic reaction to something in the blood you were given. Every precaution is taken to prevent this. The decision to have a blood  transfusion has been considered carefully by your caregiver before blood is given. Blood is not given unless the benefits outweigh the risks. AFTER THE TRANSFUSION  Right after receiving a blood transfusion, you will usually feel much better and more energetic. This is especially true if your red blood cells have gotten low (anemic). The transfusion raises the level of the red blood cells which carry oxygen, and this usually causes an energy increase.  The nurse administering the transfusion will monitor you carefully for complications. HOME CARE INSTRUCTIONS  No special instructions are needed after a transfusion. You may find your energy is better. Speak with your caregiver about any limitations on activity for  underlying diseases you may have. SEEK MEDICAL CARE IF:   Your condition is not improving after your transfusion.  You develop redness or irritation at the intravenous (IV) site. SEEK IMMEDIATE MEDICAL CARE IF:  Any of the following symptoms occur over the next 12 hours:  Shaking chills.  You have a temperature by mouth above 102 F (38.9 C), not controlled by medicine.  Chest, back, or muscle pain.  People around you feel you are not acting correctly or are confused.  Shortness of breath or difficulty breathing.  Dizziness and fainting.  You get a rash or develop hives.  You have a decrease in urine output.  Your urine turns a dark color or changes to pink, red, or brown. Any of the following symptoms occur over the next 10 days:  You have a temperature by mouth above 102 F (38.9 C), not controlled by medicine.  Shortness of breath.  Weakness after normal activity.  The white part of the eye turns yellow (jaundice).  You have a decrease in the amount of urine or are urinating less often.  Your urine turns a dark color or changes to pink, red, or brown. Document Released: 12/31/1999 Document Revised: 03/27/2011 Document Reviewed: 08/19/2007 [facility] Patient Information 2014 [place], [place].  _______________________________________________________________________

## 2016-01-19 ENCOUNTER — Encounter (HOSPITAL_COMMUNITY): Payer: Self-pay

## 2016-01-19 ENCOUNTER — Encounter (HOSPITAL_COMMUNITY)
Admission: RE | Admit: 2016-01-19 | Discharge: 2016-01-19 | Disposition: A | Discharge status: Home / Self Care | Payer: Managed Care, Other (non HMO) | Source: Ambulatory Visit | Attending: Orthopedic Surgery | Admitting: Orthopedic Surgery

## 2016-01-19 DIAGNOSIS — Z01812 Encounter for preprocedural laboratory examination: Secondary | ICD-10-CM | POA: Insufficient documentation

## 2016-01-19 DIAGNOSIS — M1611 Unilateral primary osteoarthritis, right hip: Secondary | ICD-10-CM | POA: Diagnosis not present

## 2016-01-19 HISTORY — DX: Personal history of urinary calculi: Z87.442

## 2016-01-19 HISTORY — DX: Unspecified osteoarthritis, unspecified site: M19.90

## 2016-01-19 HISTORY — DX: Calculus of gallbladder without cholecystitis without obstruction: K80.20

## 2016-01-19 LAB — CBC
HEMATOCRIT: 38 % (ref 36.0–46.0)
HEMOGLOBIN: 12.4 g/dL (ref 12.0–15.0)
MCH: 29.3 pg (ref 26.0–34.0)
MCHC: 32.6 g/dL (ref 30.0–36.0)
MCV: 89.8 fL (ref 78.0–100.0)
Platelets: 248 10*3/uL (ref 150–400)
RBC: 4.23 MIL/uL (ref 3.87–5.11)
RDW: 13.2 % (ref 11.5–15.5)
WBC: 6.9 10*3/uL (ref 4.0–10.5)

## 2016-01-19 LAB — SURGICAL PCR SCREEN
MRSA, PCR: NEGATIVE
STAPHYLOCOCCUS AUREUS: NEGATIVE

## 2016-01-19 LAB — ABO/RH: ABO/RH(D): O POS

## 2016-01-19 NOTE — Pre-Procedure Instructions (Signed)
EKG 12-24-15 with chart, LOV notes/clearance -Hiliary Gordnier,PA with chart. CMP 12-31-15 with chart.

## 2016-01-25 ENCOUNTER — Inpatient Hospital Stay (HOSPITAL_COMMUNITY)
Admission: RE | Admit: 2016-01-25 | Discharge: 2016-01-26 | DRG: 470 | Disposition: A | Discharge status: Home Health | Payer: Managed Care, Other (non HMO) | Source: Ambulatory Visit | Attending: Orthopedic Surgery | Admitting: Orthopedic Surgery

## 2016-01-25 ENCOUNTER — Inpatient Hospital Stay (HOSPITAL_COMMUNITY): Payer: Managed Care, Other (non HMO)

## 2016-01-25 ENCOUNTER — Inpatient Hospital Stay (HOSPITAL_COMMUNITY): Payer: Managed Care, Other (non HMO) | Admitting: Certified Registered Nurse Anesthetist

## 2016-01-25 ENCOUNTER — Encounter (HOSPITAL_COMMUNITY): Payer: Self-pay | Admitting: *Deleted

## 2016-01-25 ENCOUNTER — Encounter (HOSPITAL_COMMUNITY)
Admission: RE | Disposition: A | Discharge status: Home Health | Payer: Self-pay | Source: Ambulatory Visit | Attending: Orthopedic Surgery

## 2016-01-25 DIAGNOSIS — M1611 Unilateral primary osteoarthritis, right hip: Principal | ICD-10-CM | POA: Diagnosis present

## 2016-01-25 DIAGNOSIS — E785 Hyperlipidemia, unspecified: Secondary | ICD-10-CM | POA: Diagnosis present

## 2016-01-25 DIAGNOSIS — R110 Nausea: Secondary | ICD-10-CM | POA: Diagnosis not present

## 2016-01-25 DIAGNOSIS — Z87891 Personal history of nicotine dependence: Secondary | ICD-10-CM

## 2016-01-25 DIAGNOSIS — M25551 Pain in right hip: Secondary | ICD-10-CM | POA: Diagnosis present

## 2016-01-25 DIAGNOSIS — Z96649 Presence of unspecified artificial hip joint: Secondary | ICD-10-CM

## 2016-01-25 DIAGNOSIS — R42 Dizziness and giddiness: Secondary | ICD-10-CM | POA: Diagnosis not present

## 2016-01-25 DIAGNOSIS — Z96641 Presence of right artificial hip joint: Secondary | ICD-10-CM

## 2016-01-25 DIAGNOSIS — E663 Overweight: Secondary | ICD-10-CM | POA: Diagnosis present

## 2016-01-25 HISTORY — PX: TOTAL HIP ARTHROPLASTY: SHX124

## 2016-01-25 LAB — TYPE AND SCREEN
ABO/RH(D): O POS
Antibody Screen: NEGATIVE

## 2016-01-25 SURGERY — ARTHROPLASTY, HIP, TOTAL, ANTERIOR APPROACH
Anesthesia: Spinal | Site: Hip | Laterality: Right

## 2016-01-25 MED ORDER — ONDANSETRON HCL 4 MG/2ML IJ SOLN
INTRAMUSCULAR | Status: DC | PRN
  Administered 2016-01-25: 4 mg via INTRAVENOUS

## 2016-01-25 MED ORDER — ONDANSETRON HCL 4 MG/2ML IJ SOLN: 4.0000 mg | Freq: Once | INTRAMUSCULAR | Status: DC | PRN

## 2016-01-25 MED ORDER — SODIUM CHLORIDE 0.9 % IR SOLN
Status: DC | PRN
Start: 2016-01-25 — End: 2016-01-25
  Administered 2016-01-25: 1000 mL

## 2016-01-25 MED ORDER — MAGNESIUM CITRATE PO SOLN: 1.0000 | Freq: Once | ORAL | Status: DC | PRN

## 2016-01-25 MED ORDER — CEFAZOLIN SODIUM-DEXTROSE 2-4 GM/100ML-% IV SOLN
2.0000 g | INTRAVENOUS | Status: AC
  Administered 2016-01-25: 2 g via INTRAVENOUS
  Filled 2016-01-25: qty 100

## 2016-01-25 MED ORDER — METHOCARBAMOL 500 MG PO TABS: 500.0000 mg | ORAL_TABLET | Freq: Four times a day (QID) | ORAL | 0 refills | Status: AC | PRN

## 2016-01-25 MED ORDER — CEFAZOLIN SODIUM-DEXTROSE 2-4 GM/100ML-% IV SOLN
2.0000 g | Freq: Four times a day (QID) | INTRAVENOUS | Status: AC
  Administered 2016-01-25 (×2): 2 g via INTRAVENOUS
  Filled 2016-01-25 (×2): qty 100

## 2016-01-25 MED ORDER — FENTANYL CITRATE (PF) 100 MCG/2ML IJ SOLN
INTRAMUSCULAR | Status: AC
  Filled 2016-01-25: qty 2

## 2016-01-25 MED ORDER — PROPOFOL 500 MG/50ML IV EMUL
INTRAVENOUS | Status: DC | PRN
  Administered 2016-01-25: 100 ug/kg/min via INTRAVENOUS

## 2016-01-25 MED ORDER — CHLORHEXIDINE GLUCONATE 4 % EX LIQD: 60.0000 mL | Freq: Once | CUTANEOUS | Status: DC

## 2016-01-25 MED ORDER — ONDANSETRON HCL 4 MG/2ML IJ SOLN
INTRAMUSCULAR | Status: AC
  Filled 2016-01-25: qty 2

## 2016-01-25 MED ORDER — MIDAZOLAM HCL 2 MG/2ML IJ SOLN
INTRAMUSCULAR | Status: AC
  Filled 2016-01-25: qty 2

## 2016-01-25 MED ORDER — ATORVASTATIN CALCIUM 10 MG PO TABS: 10.0000 mg | ORAL_TABLET | Freq: Every day | ORAL | Status: DC

## 2016-01-25 MED ORDER — OXYCODONE HCL 5 MG PO TABS: 5.0000 mg | ORAL_TABLET | Freq: Once | ORAL | Status: DC | PRN

## 2016-01-25 MED ORDER — SODIUM CHLORIDE 0.9 % IV SOLN
100.0000 mL/h | INTRAVENOUS | Status: DC
  Administered 2016-01-25 – 2016-01-26 (×2): 100 mL/h via INTRAVENOUS
  Filled 2016-01-25 (×5): qty 1000

## 2016-01-25 MED ORDER — POLYETHYLENE GLYCOL 3350 17 G PO PACK
17.0000 g | PACK | Freq: Two times a day (BID) | ORAL | Status: DC
  Filled 2016-01-25: qty 1

## 2016-01-25 MED ORDER — ONDANSETRON HCL 4 MG PO TABS: 4.0000 mg | ORAL_TABLET | Freq: Four times a day (QID) | ORAL | Status: DC | PRN

## 2016-01-25 MED ORDER — MENTHOL 3 MG MT LOZG: 1.0000 | LOZENGE | OROMUCOSAL | Status: DC | PRN

## 2016-01-25 MED ORDER — PHENOL 1.4 % MT LIQD: 1.0000 | OROMUCOSAL | Status: DC | PRN

## 2016-01-25 MED ORDER — LACTATED RINGERS IV SOLN
INTRAVENOUS | Status: DC | PRN
  Administered 2016-01-25 (×2): via INTRAVENOUS

## 2016-01-25 MED ORDER — TRANEXAMIC ACID 1000 MG/10ML IV SOLN
1000.0000 mg | INTRAVENOUS | Status: AC
  Administered 2016-01-25: 1000 mg via INTRAVENOUS
  Filled 2016-01-25: qty 1100

## 2016-01-25 MED ORDER — HYDROCODONE-ACETAMINOPHEN 7.5-325 MG PO TABS
1.0000 | ORAL_TABLET | ORAL | 0 refills | Status: AC | PRN
Start: 2016-01-25 — End: ?

## 2016-01-25 MED ORDER — DEXAMETHASONE SODIUM PHOSPHATE 10 MG/ML IJ SOLN
10.0000 mg | Freq: Once | INTRAMUSCULAR | Status: AC
  Administered 2016-01-25: 10 mg via INTRAVENOUS

## 2016-01-25 MED ORDER — DOCUSATE SODIUM 100 MG PO CAPS
100.0000 mg | ORAL_CAPSULE | Freq: Two times a day (BID) | ORAL | Status: DC
  Administered 2016-01-26: 12:00:00 100 mg via ORAL
  Filled 2016-01-25: qty 1

## 2016-01-25 MED ORDER — EPHEDRINE SULFATE-NACL 50-0.9 MG/10ML-% IV SOSY
PREFILLED_SYRINGE | INTRAVENOUS | Status: DC | PRN
  Administered 2016-01-25: 10 mg via INTRAVENOUS
  Administered 2016-01-25: 5 mg via INTRAVENOUS

## 2016-01-25 MED ORDER — ALUM & MAG HYDROXIDE-SIMETH 200-200-20 MG/5ML PO SUSP: 30.0000 mL | ORAL | Status: DC | PRN

## 2016-01-25 MED ORDER — DEXAMETHASONE SODIUM PHOSPHATE 10 MG/ML IJ SOLN
10.0000 mg | Freq: Once | INTRAMUSCULAR | Status: AC
  Administered 2016-01-26: 08:00:00 10 mg via INTRAVENOUS
  Filled 2016-01-25: qty 1

## 2016-01-25 MED ORDER — FOLIC ACID 1 MG PO TABS
1.0000 mg | ORAL_TABLET | Freq: Every day | ORAL | Status: DC
  Administered 2016-01-26: 1 mg via ORAL
  Filled 2016-01-25: qty 1

## 2016-01-25 MED ORDER — METHOCARBAMOL 500 MG PO TABS: 500.0000 mg | ORAL_TABLET | Freq: Four times a day (QID) | ORAL | Status: DC | PRN

## 2016-01-25 MED ORDER — FENTANYL CITRATE (PF) 100 MCG/2ML IJ SOLN: 25.0000 ug | INTRAMUSCULAR | Status: DC | PRN

## 2016-01-25 MED ORDER — DIPHENHYDRAMINE HCL 25 MG PO CAPS: 25.0000 mg | ORAL_CAPSULE | Freq: Four times a day (QID) | ORAL | Status: DC | PRN

## 2016-01-25 MED ORDER — HYDROMORPHONE HCL 1 MG/ML IJ SOLN
0.5000 mg | INTRAMUSCULAR | Status: DC | PRN
  Administered 2016-01-25: 1 mg via INTRAVENOUS
  Filled 2016-01-25: qty 1

## 2016-01-25 MED ORDER — CELECOXIB 200 MG PO CAPS
200.0000 mg | ORAL_CAPSULE | Freq: Two times a day (BID) | ORAL | Status: DC
  Administered 2016-01-25 – 2016-01-26 (×2): 200 mg via ORAL
  Filled 2016-01-25 (×2): qty 1

## 2016-01-25 MED ORDER — HYDROCODONE-ACETAMINOPHEN 7.5-325 MG PO TABS
1.0000 | ORAL_TABLET | ORAL | Status: DC
  Administered 2016-01-25: 15:00:00 2 via ORAL
  Administered 2016-01-25: 1 via ORAL
  Administered 2016-01-25 – 2016-01-26 (×3): 2 via ORAL
  Filled 2016-01-25 (×4): qty 2
  Filled 2016-01-25: qty 1

## 2016-01-25 MED ORDER — PHENYLEPHRINE HCL 10 MG/ML IJ SOLN
INTRAMUSCULAR | Status: AC
  Filled 2016-01-25: qty 1

## 2016-01-25 MED ORDER — BISACODYL 10 MG RE SUPP: 10.0000 mg | Freq: Every day | RECTAL | Status: DC | PRN

## 2016-01-25 MED ORDER — MIDAZOLAM HCL 5 MG/5ML IJ SOLN
INTRAMUSCULAR | Status: DC | PRN
  Administered 2016-01-25: 2 mg via INTRAVENOUS

## 2016-01-25 MED ORDER — ONDANSETRON HCL 4 MG/2ML IJ SOLN
4.0000 mg | Freq: Four times a day (QID) | INTRAMUSCULAR | Status: DC | PRN
  Administered 2016-01-25 – 2016-01-26 (×2): 4 mg via INTRAVENOUS
  Filled 2016-01-25 (×2): qty 2

## 2016-01-25 MED ORDER — PROPOFOL 10 MG/ML IV BOLUS
INTRAVENOUS | Status: AC
  Filled 2016-01-25: qty 40

## 2016-01-25 MED ORDER — OXYCODONE HCL 5 MG/5ML PO SOLN: 5.0000 mg | Freq: Once | ORAL | Status: DC | PRN

## 2016-01-25 MED ORDER — SODIUM CHLORIDE 0.9 % IV SOLN
INTRAVENOUS | Status: DC | PRN
  Administered 2016-01-25: 25 ug/min via INTRAVENOUS

## 2016-01-25 MED ORDER — ASPIRIN 81 MG PO CHEW
81.0000 mg | CHEWABLE_TABLET | Freq: Two times a day (BID) | ORAL | Status: DC
  Administered 2016-01-25 – 2016-01-26 (×2): 81 mg via ORAL
  Filled 2016-01-25 (×2): qty 1

## 2016-01-25 MED ORDER — METOCLOPRAMIDE HCL 5 MG PO TABS: 5.0000 mg | ORAL_TABLET | Freq: Three times a day (TID) | ORAL | Status: DC | PRN

## 2016-01-25 MED ORDER — METHOCARBAMOL 1000 MG/10ML IJ SOLN
500.0000 mg | Freq: Four times a day (QID) | INTRAVENOUS | Status: DC | PRN
  Administered 2016-01-25: 500 mg via INTRAVENOUS
  Filled 2016-01-25: qty 550
  Filled 2016-01-25: qty 5

## 2016-01-25 MED ORDER — STERILE WATER FOR IRRIGATION IR SOLN
Status: DC | PRN
  Administered 2016-01-25: 2000 mL

## 2016-01-25 MED ORDER — BUPIVACAINE IN DEXTROSE 0.75-8.25 % IT SOLN
INTRATHECAL | Status: DC | PRN
  Administered 2016-01-25: 2 mL via INTRATHECAL

## 2016-01-25 MED ORDER — ASPIRIN 81 MG PO CHEW: 81.0000 mg | CHEWABLE_TABLET | Freq: Two times a day (BID) | ORAL | 0 refills | Status: AC

## 2016-01-25 MED ORDER — FERROUS SULFATE 325 (65 FE) MG PO TABS
325.0000 mg | ORAL_TABLET | Freq: Three times a day (TID) | ORAL | Status: DC
  Administered 2016-01-26: 325 mg via ORAL
  Filled 2016-01-25: qty 1

## 2016-01-25 MED ORDER — EPHEDRINE 5 MG/ML INJ
INTRAVENOUS | Status: AC
  Filled 2016-01-25: qty 10

## 2016-01-25 MED ORDER — CEFAZOLIN SODIUM-DEXTROSE 2-4 GM/100ML-% IV SOLN
INTRAVENOUS | Status: AC
  Filled 2016-01-25: qty 100

## 2016-01-25 MED ORDER — FENTANYL CITRATE (PF) 100 MCG/2ML IJ SOLN
INTRAMUSCULAR | Status: DC | PRN
  Administered 2016-01-25 (×2): 50 ug via INTRAVENOUS

## 2016-01-25 MED ORDER — METOCLOPRAMIDE HCL 5 MG/ML IJ SOLN
5.0000 mg | Freq: Three times a day (TID) | INTRAMUSCULAR | Status: DC | PRN
  Administered 2016-01-25: 17:00:00 10 mg via INTRAVENOUS
  Filled 2016-01-25: qty 2

## 2016-01-25 MED ORDER — DEXAMETHASONE SODIUM PHOSPHATE 10 MG/ML IJ SOLN
INTRAMUSCULAR | Status: AC
  Filled 2016-01-25: qty 1

## 2016-01-25 SURGICAL SUPPLY — 34 items
ADH SKN CLS APL DERMABOND .7 (GAUZE/BANDAGES/DRESSINGS) ×1
BAG SPEC THK2 15X12 ZIP CLS (MISCELLANEOUS) ×1
BAG ZIPLOCK 12X15 (MISCELLANEOUS) ×2 IMPLANT
BLADE SAG 18X100X1.27 (BLADE) ×3 IMPLANT
CAPT HIP TOTAL 2 ×2 IMPLANT
CLOTH BEACON ORANGE TIMEOUT ST (SAFETY) ×3 IMPLANT
COVER PERINEAL POST (MISCELLANEOUS) ×3 IMPLANT
DERMABOND ADVANCED (GAUZE/BANDAGES/DRESSINGS) ×2
DERMABOND ADVANCED .7 DNX12 (GAUZE/BANDAGES/DRESSINGS) ×1 IMPLANT
DRAPE STERI IOBAN 125X83 (DRAPES) ×3 IMPLANT
DRAPE U-SHAPE 47X51 STRL (DRAPES) ×6 IMPLANT
DRSG AQUACEL AG ADV 3.5X10 (GAUZE/BANDAGES/DRESSINGS) ×2 IMPLANT
DURAPREP 26ML APPLICATOR (WOUND CARE) ×3 IMPLANT
ELECT REM PT RETURN 9FT ADLT (ELECTROSURGICAL) ×3
ELECTRODE REM PT RTRN 9FT ADLT (ELECTROSURGICAL) ×1 IMPLANT
GLOVE BIOGEL PI IND STRL 7.5 (GLOVE) ×1 IMPLANT
GLOVE BIOGEL PI IND STRL 8.5 (GLOVE) ×1 IMPLANT
GLOVE BIOGEL PI INDICATOR 7.5 (GLOVE) ×8
GLOVE BIOGEL PI INDICATOR 8.5 (GLOVE) ×2
GLOVE ECLIPSE 8.0 STRL XLNG CF (GLOVE) ×6 IMPLANT
GLOVE ORTHO TXT STRL SZ7.5 (GLOVE) ×3 IMPLANT
GLOVE SURG SS PI 7.0 STRL IVOR (GLOVE) ×2 IMPLANT
GOWN STRL REUS W/ TWL XL LVL3 (GOWN DISPOSABLE) IMPLANT
GOWN STRL REUS W/TWL LRG LVL3 (GOWN DISPOSABLE) ×3 IMPLANT
GOWN STRL REUS W/TWL XL LVL3 (GOWN DISPOSABLE) ×8 IMPLANT
HOLDER FOLEY CATH W/STRAP (MISCELLANEOUS) ×3 IMPLANT
PACK ANTERIOR HIP CUSTOM (KITS) ×3 IMPLANT
SUT MNCRL AB 4-0 PS2 18 (SUTURE) ×3 IMPLANT
SUT VIC AB 1 CT1 36 (SUTURE) ×9 IMPLANT
SUT VIC AB 2-0 CT1 27 (SUTURE) ×6
SUT VIC AB 2-0 CT1 TAPERPNT 27 (SUTURE) ×2 IMPLANT
SUT VLOC 180 0 24IN GS25 (SUTURE) ×3 IMPLANT
TRAY FOLEY CATH SILVER 14FR (SET/KITS/TRAYS/PACK) ×2 IMPLANT
YANKAUER SUCT BULB TIP 10FT TU (MISCELLANEOUS) ×2 IMPLANT

## 2016-01-25 NOTE — Interval H&P Note (Signed)
History and Physical Interval Note:  01/25/2016 7:09 AM  Lynn Odonnell  has presented today for surgery, with the diagnosis of right hip osteoarthritis  The various methods of treatment have been discussed with the patient and family. After consideration of risks, benefits and other options for treatment, the patient has consented to  Procedure(s): RIGHT TOTAL HIP ARTHROPLASTY ANTERIOR APPROACH (Right) as a surgical intervention .  The patient's history has been reviewed, patient examined, no change in status, stable for surgery.  I have reviewed the patient's chart and labs.  Questions were answered to the patient's satisfaction.     Shelda PalLIN,Bethel Sirois D

## 2016-01-25 NOTE — Anesthesia Postprocedure Evaluation (Addendum)
Anesthesia Post Note  Patient: Lynn Odonnell  Procedure(s) Performed: Procedure(s) (LRB): RIGHT TOTAL HIP ARTHROPLASTY ANTERIOR APPROACH (Right)  Patient location during evaluation: PACU Anesthesia Type: MAC and Spinal Level of consciousness: awake, awake and alert and oriented Pain management: pain level controlled Vital Signs Assessment: post-procedure vital signs reviewed and stable Respiratory status: spontaneous breathing, respiratory function stable and nonlabored ventilation Cardiovascular status: blood pressure returned to baseline Anesthetic complications: no       Last Vitals:  Vitals:   01/25/16 1605 01/25/16 1728  BP: 92/61 105/61  Pulse:  67  Resp:  16  Temp:  36.7 C    Last Pain:  Vitals:   01/25/16 1728  TempSrc: Oral  PainSc:                  [name] COKER

## 2016-01-25 NOTE — Discharge Instructions (Signed)

## 2016-01-25 NOTE — Anesthesia Procedure Notes (Signed)
Spinal  End time: 01/25/2016 7:21 AM Staffing Anesthesiologist: Noreene LarssonJOSLIN, DAVID Resident/CRNA: Orest DikesPETERS, Alphons Burgert J Performed: resident/CRNA  Preanesthetic Checklist Completed: patient identified, site marked, surgical consent, pre-op evaluation, timeout performed, IV checked, risks and benefits discussed and monitors and equipment checked Spinal Block Patient position: sitting Prep: DuraPrep Patient monitoring: heart rate, cardiac monitor, continuous pulse ox and blood pressure Approach: midline Location: L3-4 Injection technique: single-shot Needle Needle type: Pencan  Needle gauge: 24 G Needle length: 10 cm Additional Notes Single shot, good flow, negative heme. LOT # [id number]. Expiration date: 06-15-2017.

## 2016-01-25 NOTE — Transfer of Care (Signed)
Immediate Anesthesia Transfer of Care Note  Patient: Lynn BaseJessica L Nakayama  Procedure(s) Performed: Procedure(s): RIGHT TOTAL HIP ARTHROPLASTY ANTERIOR APPROACH (Right)  Patient Location: PACU  Anesthesia Type:Spinal  Level of Consciousness:  sedated, patient cooperative and responds to stimulation  Airway & Oxygen Therapy:Patient Spontanous Breathing and Patient connected to face mask oxgen  Post-op Assessment:  Report given to PACU RN and Post -op Vital signs reviewed and stable  Post vital signs:  Reviewed and stable  Last Vitals:  Vitals:   01/25/16 0541  BP: 122/82  Pulse: 70  Resp: 16  Temp: 36.4 C    Complications: No apparent anesthesia complications

## 2016-01-25 NOTE — Op Note (Signed)
NAME:  Lynn Odonnell                ACCOUNT NO.: [id number]      MEDICAL RECORD NO.: [id number]      [facility]      PHYSICIAN:  Durene RomansLIN,Marcellene Shivley D  DATE OF BIRTH:  05-Sep-1960     DATE OF PROCEDURE:  01/25/2016                                 OPERATIVE REPORT         PREOPERATIVE DIAGNOSIS: Right  hip osteoarthritis.      POSTOPERATIVE DIAGNOSIS:  Right hip osteoarthritis.      PROCEDURE:  Right total hip replacement through an anterior approach   utilizing DePuy THR system, component size 52mm pinnacle cup, a size 36+4 neutral   Altrex liner, a size 3 Hi Tri Lock stem with a 36+5 delta ceramic   ball.      SURGEON:  Madlyn FrankelMatthew D. Charlann Boxerlin, M.D.      ASSISTANT:  Lanney GinsMatthew Babish, PA-C     ANESTHESIA:  Spinal.      SPECIMENS:  None.      COMPLICATIONS:  None.      BLOOD LOSS:  500 cc     DRAINS:  None.      INDICATION OF THE PROCEDURE:  Lynn Odonnell is a 56 y.o. female who had   presented to office for evaluation of right hip pain.  Radiographs revealed   progressive degenerative changes with bone-on-bone   articulation to the  hip joint.  The patient had painful limited range of   motion significantly affecting their overall quality of life.  The patient was failing to    respond to conservative measures, and at this point was ready   to proceed with more definitive measures.  The patient has noted progressive   degenerative changes in his hip, progressive problems and dysfunction   with regarding the hip prior to surgery.  Consent was obtained for   benefit of pain relief.  Specific risk of infection, DVT, component   failure, dislocation, need for revision surgery, as well discussion of   the anterior versus posterior approach were reviewed.  Consent was   obtained for benefit of anterior pain relief through an anterior   approach.      PROCEDURE IN DETAIL:  The patient was brought to operative theater.   Once adequate anesthesia,  preoperative antibiotics, 2gm of Ancef, 1 gm of Tranexamic Acid, and 10 mg of Decadron administered.   The patient was positioned supine on the OSI Hanna table.  Once adequate   padding of boney process was carried out, we had predraped out the hip, and  used fluoroscopy to confirm orientation of the pelvis and position.      The right hip was then prepped and draped from proximal iliac crest to   mid thigh with shower curtain technique.      Time-out was performed identifying the patient, planned procedure, and   extremity.     An incision was then made 2 cm distal and lateral to the   anterior superior iliac spine extending over the orientation of the   tensor fascia lata muscle and sharp dissection was carried down to the   fascia of the muscle and protractor placed in the soft tissues.      The fascia was  then incised.  The muscle belly was identified and swept   laterally and retractor placed along the superior neck.  Following   cauterization of the circumflex vessels and removing some pericapsular   fat, a second cobra retractor was placed on the inferior neck.  A third   retractor was placed on the anterior acetabulum after elevating the   anterior rectus.  A L-capsulotomy was along the line of the   superior neck to the trochanteric fossa, then extended proximally and   distally.  Tag sutures were placed and the retractors were then placed   intracapsular.  We then identified the trochanteric fossa and   orientation of my neck cut, confirmed this radiographically   and then made a neck osteotomy with the femur on traction.  The femoral   head was removed without difficulty or complication.  Traction was let   off and retractors were placed posterior and anterior around the   acetabulum.      The labrum and foveal tissue were debrided.  I began reaming with a 46mm   reamer and reamed up to 51mm reamer with good bony bed preparation and a 52mm   cup was chosen.  The final 52mm  Pinnacle cup was then impacted under fluoroscopy  to confirm the depth of penetration and orientation with respect to   abduction.  A screw was placed followed by the hole eliminator.  The final   36+4 neutral Altrex liner was impacted with good visualized rim fit.  The cup was positioned anatomically within the acetabular portion of the pelvis.      At this point, the femur was rolled at 80 degrees.  Further capsule was   released off the inferior aspect of the femoral neck.  I then   released the superior capsule proximally.  The hook was placed laterally   along the femur and elevated manually and held in position with the bed   hook.  The leg was then extended and adducted with the leg rolled to 100   degrees of external rotation.  Once the proximal femur was fully   exposed, I used a box osteotome to set orientation.  I then began   broaching with the starting chili pepper broach and passed this by hand and then broached up to 3.  With the 3 broach in place I chose a high offset neck and did several trial reductions.  The offset was appropriate, leg lengths   appeared to be equal best matched with a +5 head ball confirmed radiographically.   Given these findings, I went ahead and dislocated the hip, repositioned all   retractors and positioned the right hip in the extended and abducted position.  The final 3Hi Tri Lock stem was   chosen and it was impacted down to the level of neck cut.  Based on this   and the trial reduction, a 36+5 delta ceramic ball was chosen and   impacted onto a clean and dry trunnion, and the hip was reduced.  The   hip had been irrigated throughout the case again at this point.  I did   reapproximate the superior capsular leaflet to the anterior leaflet   using #1 Vicryl.  The fascia of the   tensor fascia lata muscle was then reapproximated using #1 Vicryl and #0 V-lock sutures.  The   remaining wound was closed with 2-0 Vicryl and running 4-0 Monocryl.   The  hip was cleaned, dried, and dressed  sterilely using Dermabond and   Aquacel dressing.  She was then brought   to recovery room in stable condition tolerating the procedure well.    Lanney Gins, PA-C was present for the entirety of the case involved from   preoperative positioning, perioperative retractor management, general   facilitation of the case, as well as primary wound closure as assistant.            Madlyn Frankel Charlann Boxer, M.D.        01/25/2016 8:43 AM

## 2016-01-25 NOTE — Anesthesia Postprocedure Evaluation (Signed)
Anesthesia Post Note  Patient: Lynn Odonnell  Procedure(s) Performed: Procedure(s) (LRB): RIGHT TOTAL HIP ARTHROPLASTY ANTERIOR APPROACH (Right)  Anesthesia Type: MAC and Spinal Level of consciousness: awake, awake and alert and oriented Pain management: pain level controlled Vital Signs Assessment: post-procedure vital signs reviewed and stable Respiratory status: spontaneous breathing, respiratory function stable and nonlabored ventilation Cardiovascular status: blood pressure returned to baseline Postop Assessment: no headache and spinal receding Anesthetic complications: no       Last Vitals:  Vitals:   01/25/16 0916 01/25/16 0922  BP: (!) 83/56 (!) 100/58  Pulse: 66 66  Resp: 15 16  Temp:      Last Pain:  Vitals:   01/25/16 0906  TempSrc:   PainSc: (P) 0-No pain                 Margene Cherian COKER

## 2016-01-25 NOTE — Progress Notes (Signed)
Portable AP Pelvis and Lateral Right Hip X-rays done. 

## 2016-01-25 NOTE — Progress Notes (Signed)
X-ray results noted 

## 2016-01-25 NOTE — Anesthesia Preprocedure Evaluation (Signed)
Anesthesia Evaluation  Patient identified by MRN, date of birth, ID band Patient awake    Reviewed: Allergy & Precautions, NPO status , Patient's Chart, lab work & pertinent test results  Airway Mallampati: II  TM Distance: >3 FB Neck ROM: Full    Dental  (+) Teeth Intact   Pulmonary former smoker,    breath sounds clear to auscultation       Cardiovascular  Rhythm:Regular Rate:Normal     Neuro/Psych    GI/Hepatic   Endo/Other    Renal/GU      Musculoskeletal   Abdominal   Peds  Hematology   Anesthesia Other Findings   Reproductive/Obstetrics                             Anesthesia Physical Anesthesia Plan  ASA: II  Anesthesia Plan: MAC and Spinal   Post-op Pain Management:    Induction: Intravenous  Airway Management Planned: Simple Face Mask and Natural Airway  Additional Equipment:   Intra-op Plan:   Post-operative Plan:   Informed Consent: I have reviewed the patients History and Physical, chart, labs and discussed the procedure including the risks, benefits and alternatives for the proposed anesthesia with the patient or authorized representative who has indicated his/her understanding and acceptance.   Dental advisory given  Plan Discussed with: CRNA and Anesthesiologist  Anesthesia Plan Comments:         Anesthesia Quick Evaluation

## 2016-01-25 NOTE — Evaluation (Signed)
Physical Therapy Evaluation Patient Details Name: Lynn BaseJessica L Ishii MRN: [id number] DOB: 1960/12/03 Today's Date: 01/25/2016   History of Present Illness  56 yo female s/p R THA-DA 01/25/16  Clinical Impression  On eval POD 0, pt required Min assist for mobility. Pt was only able to take a couple of steps away from the bed before becoming dizzy and hot. BP 92/61. Assisted pt back to supine. Made RN aware. Will follow and progress activity as tolerated.     Follow Up Recommendations Home health PT    Equipment Recommendations  Rolling walker with 5" wheels    Recommendations for Other Services OT consult     Precautions / Restrictions Precautions Precautions: Fall Restrictions Weight Bearing Restrictions: No RLE Weight Bearing: Weight bearing as tolerated      Mobility  Bed Mobility Overal bed mobility: Needs Assistance Bed Mobility: Supine to Sit     Supine to sit: Min assist     General bed mobility comments: Assist for R LE   Transfers Overall transfer level: Needs assistance Equipment used: Rolling walker (2 wheeled) Transfers: Sit to/from Stand Sit to Stand: Min assist         General transfer comment: Assist to rise, stabilize, control descent. VCs safety, hand placement  Ambulation/Gait Ambulation/Gait assistance: Min assist Ambulation Distance (Feet): 2 Feet Assistive device: Rolling walker (2 wheeled) Gait Pattern/deviations: Step-to pattern     General Gait Details: Pt was only able to take a couple of steps away from the bed before having to sit down due to dizziness ("Im seeing starts and Im hot!)  Stairs            Wheelchair Mobility    Modified Rankin (Stroke Patients Only)       Balance                                             Pertinent Vitals/Pain Pain Assessment: 0-10 Pain Score: 3  Pain Location: R hip Pain Descriptors / Indicators: Sore Pain Intervention(s): Limited activity within patient's  tolerance;Repositioned    Home Living Family/patient expects to be discharged to:: Private residence Living Arrangements: Spouse/significant other   Type of Home: House Home Access: Stairs to enter Entrance Stairs-Rails: Estate agentone;Right;Left Entrance Stairs-Number of Steps: 2 Home Layout: Multi-level;Bed/bath upstairs Home Equipment: None      Prior Function Level of Independence: Independent               Hand Dominance        Extremity/Trunk Assessment   Upper Extremity Assessment Upper Extremity Assessment: Overall WFL for tasks assessed    Lower Extremity Assessment Lower Extremity Assessment: Generalized weakness (R LE s/p THA)    Cervical / Trunk Assessment Cervical / Trunk Assessment: Normal  Communication   Communication: No difficulties  Cognition Arousal/Alertness: Awake/alert Behavior During Therapy: WFL for tasks assessed/performed Overall Cognitive Status: Within Functional Limits for tasks assessed                      General Comments      Exercises     Assessment/Plan    PT Assessment Patient needs continued PT services  PT Problem List Decreased strength;Decreased mobility;Decreased range of motion;Decreased activity tolerance;Decreased balance;Decreased knowledge of use of DME;Pain          PT Treatment Interventions DME instruction;Therapeutic activities;Gait training;Therapeutic exercise;Patient/family education;Functional mobility training;Stair  training;Balance training    PT Goals (Current goals can be found in the Care Plan section)  Acute Rehab PT Goals Patient Stated Goal: to walk PT Goal Formulation: With patient Time For Goal Achievement: 02/08/16 Potential to Achieve Goals: Good    Frequency 7X/week   Barriers to discharge        Co-evaluation               End of Session Equipment Utilized During Treatment: Gait belt Activity Tolerance:  (Limited by BP) Patient left: in bed;with call bell/phone  within reach           Time: 1610-9604 PT Time Calculation (min) (ACUTE ONLY): 13 min   Charges:   PT Evaluation $PT Eval Low Complexity: 1 Procedure     PT G Codes:        Rebeca Alert, MPT Pager: [phone]

## 2016-01-26 DIAGNOSIS — E663 Overweight: Secondary | ICD-10-CM | POA: Diagnosis present

## 2016-01-26 LAB — CBC
HCT: 28.5 % — ABNORMAL LOW (ref 36.0–46.0)
HEMOGLOBIN: 9.4 g/dL — AB (ref 12.0–15.0)
MCH: 29.8 pg (ref 26.0–34.0)
MCHC: 33 g/dL (ref 30.0–36.0)
MCV: 90.5 fL (ref 78.0–100.0)
Platelets: 206 10*3/uL (ref 150–400)
RBC: 3.15 MIL/uL — AB (ref 3.87–5.11)
RDW: 13.2 % (ref 11.5–15.5)
WBC: 10.2 10*3/uL (ref 4.0–10.5)

## 2016-01-26 LAB — BASIC METABOLIC PANEL
Anion gap: 4 — ABNORMAL LOW (ref 5–15)
BUN: 16 mg/dL (ref 6–20)
CHLORIDE: 107 mmol/L (ref 101–111)
CO2: 27 mmol/L (ref 22–32)
Calcium: 8.9 mg/dL (ref 8.9–10.3)
Creatinine, Ser: 0.65 mg/dL (ref 0.44–1.00)
GFR calc non Af Amer: >60 mL/min (ref >60)
Glucose, Bld: 120 mg/dL — ABNORMAL HIGH (ref 65–99)
POTASSIUM: 4.5 mmol/L (ref 3.5–5.1)
SODIUM: 138 mmol/L (ref 135–145)

## 2016-01-26 MED ORDER — DOCUSATE SODIUM 100 MG PO CAPS: 100.0000 mg | ORAL_CAPSULE | Freq: Two times a day (BID) | ORAL | 0 refills | Status: AC

## 2016-01-26 MED ORDER — POLYETHYLENE GLYCOL 3350 17 G PO PACK: 17.0000 g | PACK | Freq: Two times a day (BID) | ORAL | 0 refills | Status: AC

## 2016-01-26 MED ORDER — ONDANSETRON HCL 4 MG PO TABS: 4.0000 mg | ORAL_TABLET | Freq: Four times a day (QID) | ORAL | 0 refills | Status: AC | PRN

## 2016-01-26 MED ORDER — FERROUS SULFATE 325 (65 FE) MG PO TABS: 325.0000 mg | ORAL_TABLET | Freq: Three times a day (TID) | ORAL | 3 refills | Status: AC

## 2016-01-26 NOTE — Evaluation (Signed)
Occupational Therapy Evaluation Patient Details Name: Lynn Odonnell MRN: [id number] DOB: [birth year] Today's Date: 01/26/2016    History of Present Illness 56 yo female s/p R THA-DA 01/25/16   Clinical Impression   OT education complete.   Pt has all needed DME   Follow Up Recommendations  No OT follow up    Equipment Recommendations  None recommended by OT    Recommendations for Other Services       Precautions / Restrictions Precautions Precautions: Fall Restrictions Weight Bearing Restrictions: No RLE Weight Bearing: Weight bearing as tolerated      Mobility Bed Mobility Overal bed mobility: Needs Assistance Bed Mobility: Supine to Sit     Supine to sit: Min assist     General bed mobility comments: pt in chair  Transfers Overall transfer level: Needs assistance Equipment used: Rolling walker (2 wheeled) Transfers: Sit to/from [employer] Sit to Stand: Supervision Stand pivot transfers: Supervision       General transfer comment: Vc for hand placement         ADL Overall ADL's : Needs assistance/impaired     Grooming: Set up;Sitting   Upper Body Bathing: Set up;Sitting   Lower Body Bathing: Minimal assistance;Sit to/from stand;Cueing for safety;Cueing for sequencing   Upper Body Dressing : Supervision/safety;Sitting   Lower Body Dressing: Minimal assistance;Sit to/from stand;Cueing for sequencing   Toilet Transfer: Supervision/safety;RW;Ambulation;Cueing for sequencing;Cueing for safety   Toileting- Clothing Manipulation and Hygiene: Supervision/safety;Sit to/from stand;Cueing for safety;Cueing for sequencing     Tub/Shower Transfer Details (indicate cue type and reason): verbalized safety Functional mobility during ADLs: Minimal assistance;Cueing for safety General ADL Comments: educated on walker safety and walker bag for safety               Pertinent Vitals/Pain Pain Assessment: 0-10 Pain Score: 3  Pain  Location: R hip/thigh Pain Descriptors / Indicators: Sore Pain Intervention(s): Monitored during session     Hand Dominance     Extremity/Trunk Assessment Upper Extremity Assessment Upper Extremity Assessment: Overall WFL for tasks assessed           Communication Communication Communication: No difficulties   Cognition Arousal/Alertness: Awake/alert Behavior During Therapy: WFL for tasks assessed/performed Overall Cognitive Status: Within Functional Limits for tasks assessed                     General Comments               Home Living Family/patient expects to be discharged to:: Private residence Living Arrangements: Spouse/significant other   Type of Home: House Home Access: Stairs to enter Secretary/administrator of Steps: 2 Entrance Stairs-Rails: None;Right;Left Home Layout: Multi-level;Bed/bath upstairs Alternate Level Stairs-Number of Steps: 7+7 Alternate Level Stairs-Rails: Right           Home Equipment: None          Prior Functioning/Environment Level of Independence: Independent                 OT Problem List:     OT Treatment/Interventions:      OT Goals(Current goals can be found in the care plan section) Acute Rehab OT Goals Patient Stated Goal: to walk  OT Frequency:                End of Session Equipment Utilized During Treatment: Rolling walker Nurse Communication: Mobility status  Activity Tolerance: Patient tolerated treatment well Patient left: in chair   Time: 8119-1478 OT Time Calculation (min):  16 min Charges:  OT General Charges $OT Visit: 1 Procedure OT Evaluation $OT Eval Low Complexity: 1 Procedure G-Codes:    Alba CoryREDDING, Jeane Cashatt D 01/26/2016, 1:26 PM

## 2016-01-26 NOTE — Progress Notes (Signed)
Physical Therapy Treatment Patient Details Name: Sharmaine BaseJessica L Voshell MRN: [id number] DOB: 1960-11-15 Today's Date: 01/26/2016    History of Present Illness 56 yo female s/p R THA-DA 01/25/16    PT Comments    Progressing well with mobility. Practiced gait training and stair training. Issued HEP for pt to perform 2x/day. All education completed. Ready to d/c from PT standpoint-made RN aware.   Follow Up Recommendations  Home health PT;No PT follow up (whatever MD recommneds/orders)     Equipment Recommendations  Rolling walker with 5" wheels    Recommendations for Other Services       Precautions / Restrictions Precautions Precautions: Fall Restrictions Weight Bearing Restrictions: No RLE Weight Bearing: Weight bearing as tolerated    Mobility  Bed Mobility               General bed mobility comments: pt in chair  Transfers Overall transfer level: Needs assistance Equipment used: Rolling walker (2 wheeled) Transfers: Sit to/from Stand Sit to Stand: Supervision Stand pivot transfers: Supervision       General transfer comment: Vc for hand placement  Ambulation/Gait Ambulation/Gait assistance: Min guard Ambulation Distance (Feet): 100 Feet Assistive device: Rolling walker (2 wheeled) Gait Pattern/deviations: Step-through pattern     General Gait Details: close guard for safety. good gait speed.    Stairs Stairs: Yes   Stair Management: Step to pattern;Forwards;One rail Right Number of Stairs: 2 General stair comments: VCs safety, technique, sequence. close guard for safety.   Wheelchair Mobility    Modified Rankin (Stroke Patients Only)       Balance                                    Cognition Arousal/Alertness: Awake/alert Behavior During Therapy: WFL for tasks assessed/performed Overall Cognitive Status: Within Functional Limits for tasks assessed                      Exercises      General Comments         Pertinent Vitals/Pain Pain Assessment: 0-10 Pain Score: 3  Pain Location: R hip/thigh Pain Descriptors / Indicators: Sore Pain Intervention(s): Monitored during session    Home Living Family/patient expects to be discharged to:: Private residence Living Arrangements: Spouse/significant other   Type of Home: House Home Access: Stairs to enter Entrance Stairs-Rails: None;Right;Left Home Layout: Multi-level;Bed/bath upstairs Home Equipment: None      Prior Function Level of Independence: Independent          PT Goals (current goals can now be found in the care plan section) Acute Rehab PT Goals Patient Stated Goal: to walk Progress towards PT goals: Progressing toward goals    Frequency    7X/week      PT Plan Current plan remains appropriate    Co-evaluation             End of Session Equipment Utilized During Treatment: Gait belt Activity Tolerance: Patient tolerated treatment well Patient left: in chair;with call bell/phone within reach     Time: 1333-1347 PT Time Calculation (min) (ACUTE ONLY): 14 min  Charges:  $Gait Training: 8-22 mins                    G Codes:      Rebeca AlertJannie Jenavieve Freda, MPT Pager: [phone]

## 2016-01-26 NOTE — Progress Notes (Signed)
     Subjective: 1 Day Post-Op Procedure(s) (LRB): RIGHT TOTAL HIP ARTHROPLASTY ANTERIOR APPROACH (Right)   Patient reports pain as mild, pain controlled. No events throughout the night. Having some nausea and dizziness, which she attributes to the amount of analgesic medication.  She hasn't been up with PT, but looking forward to working well with them.  Ready to be discharge home if she works well with PT and her symptoms of nausea and dizziness resolve.  Objective:   VITALS:   Vitals:   01/26/16 0118 01/26/16 0436  BP: (!) 95/58 99/66  Pulse: 65 66  Resp: 15 14  Temp: 97.7 F (36.5 C) 98.2 F (36.8 C)    Dorsiflexion/Plantar flexion intact Incision: dressing C/D/I No cellulitis present Compartment soft  LABS  Recent Labs  01/26/16 0505  HGB 9.4*  HCT 28.5*  WBC 10.2  PLT 206     Recent Labs  01/26/16 0505  NA 138  K 4.5  BUN 16  CREATININE 0.65  GLUCOSE 120*     Assessment/Plan: 1 Day Post-Op Procedure(s) (LRB): RIGHT TOTAL HIP ARTHROPLASTY ANTERIOR APPROACH (Right) Foley cath d/c'ed Advance diet Up with therapy D/C IV fluids Discharge home [facility]. Follow up with OLIN,Shakiera Edelson D in 2 weeks.  Contact information:  [facility] [address], [address] [place] [id number] [phone]    Overweight (BMI 25-29.9) Estimated body mass index is 27.63 kg/m as calculated from the following:   Height as of this encounter: 5\' 3"  (1.6 m).   Weight as of this encounter: 70.8 kg (156 lb). Patient also counseled that weight may inhibit the healing process Patient counseled that losing weight will help with future health issues         Anastasio AuerbachMatthew S. Laveyah Oriol   PAC  01/26/2016, 8:45 AM

## 2016-01-26 NOTE — Progress Notes (Signed)
Physical Therapy Treatment Patient Details Name: Lynn Odonnell MRN: [id number] DOB: [birth year] Today's Date: 01/26/2016    History of Present Illness 56 yo female s/p R THA-DA 01/25/16    PT Comments    Progressing well with mobility. Will plan to have a 2nd session prior to d/c later today.   Follow Up Recommendations  Home health PT     Equipment Recommendations  Rolling walker with 5" wheels    Recommendations for Other Services       Precautions / Restrictions Precautions Precautions: Fall Restrictions Weight Bearing Restrictions: No RLE Weight Bearing: Weight bearing as tolerated    Mobility  Bed Mobility Overal bed mobility: Needs Assistance Bed Mobility: Supine to Sit     Supine to sit: Min assist     General bed mobility comments: Assist for R LE   Transfers Overall transfer level: Needs assistance Equipment used: Rolling walker (2 wheeled) Transfers: Sit to/from Stand Sit to Stand: Min guard         General transfer comment: close guard for safety.  VCs safety, hand placement  Ambulation/Gait Ambulation/Gait assistance: Min guard Ambulation Distance (Feet): 100 Feet Assistive device: Rolling walker (2 wheeled) Gait Pattern/deviations: Step-to pattern;Step-through pattern;Decreased stride length     General Gait Details: close guard for safety. slow gait speed. Pt denied dizzines/lightheadedness this session   Stairs            Wheelchair Mobility    Modified Rankin (Stroke Patients Only)       Balance                                    Cognition Arousal/Alertness: Awake/alert Behavior During Therapy: WFL for tasks assessed/performed Overall Cognitive Status: Within Functional Limits for tasks assessed                      Exercises Total Joint Exercises Ankle Circles/Pumps: AROM;Both;15 reps;Supine Quad Sets: AROM;Both;10 reps;Supine Heel Slides: AAROM;Right;10 reps;Supine Hip ABduction/ADduction:  AAROM;Right;10 reps;Supine    General Comments        Pertinent Vitals/Pain Pain Assessment: 0-10 Pain Score: 4  Pain Location: R hip/thigh Pain Descriptors / Indicators: Sore Pain Intervention(s): Repositioned;Monitored during session    Home Living                      Prior Function            PT Goals (current goals can now be found in the care plan section) Progress towards PT goals: Progressing toward goals    Frequency    7X/week      PT Plan Current plan remains appropriate    Co-evaluation             End of Session Equipment Utilized During Treatment: Gait belt Activity Tolerance: Patient tolerated treatment well Patient left: in chair;with call bell/phone within reach     Time: 0940-1001 PT Time Calculation (min) (ACUTE ONLY): 21 min  Charges:  $Gait Training: 8-22 mins                    G Codes:      Rebeca AlertJannie Bond Grieshop, MPT Pager: [phone]

## 2016-01-31 NOTE — Discharge Summary (Signed)
Physician Discharge Summary  Patient ID: Lynn Odonnell MRN: [id number] DOB/AGE: December 09, 1960 56 y.o.  Admit date: 01/25/2016 Discharge date: 01/26/2016   Procedures:  Procedure(s) (LRB): RIGHT TOTAL HIP ARTHROPLASTY ANTERIOR APPROACH (Right)  Attending Physician:  Dr. Durene Romans   Admission Diagnoses:   Right hip primary OA / pain  Discharge Diagnoses:  Principal Problem:   S/P right THA, AA Active Problems:   Overweight (BMI 25.0-29.9)  Past Medical History:  Diagnosis Date  . Anxiety   . Arthritis    osteoarthritis-right hip, tendonitis -heels.  . Asymptomatic gallstones   . History of kidney stones    x 1 episode-lithotripsy.  Marland Kitchen Hyperlipidemia   . Increased homocysteine (HCC)    mother has history of this-not pt personally."makes prone to clot formation"  . Urolithiasis    history x1 episode-resolved    HPI:    Lynn Odonnell, 56 y.o. female, has a history of pain and functional disability in the right hip(s) due to arthritis and patient has failed non-surgical conservative treatments for greater than 12 weeks to include NSAID's and/or analgesics, corticosteriod injections and activity modification.  Onset of symptoms was gradual starting 1+ years ago with gradually worsening course since that time.The patient noted no past surgery on the right hip(s).  Patient currently rates pain in the right hip at 6 out of 10 with activity. Patient has night pain, worsening of pain with activity and weight bearing, trendelenberg gait, pain that interfers with activities of daily living and pain with passive range of motion. Patient has evidence of periarticular osteophytes and joint space narrowing by imaging studies. This condition presents safety issues increasing the risk of falls. There is no current active infection.   Risks, benefits and expectations were discussed with the patient.  Risks including but not limited to the risk of anesthesia, blood clots, nerve damage, blood vessel  damage, failure of the prosthesis, infection and up to and including death.  Patient understand the risks, benefits and expectations and wishes to proceed with surgery.   PCP: Daylene Katayama, PA   Discharged Condition: good  Hospital Course:  Patient underwent the above stated procedure on 01/25/2016. Patient tolerated the procedure well and brought to the recovery room in good condition and subsequently to the floor.  POD #1 BP: 99/66 ; Pulse: 66 ; Temp: 98.2 F (36.8 C) ; Resp: 14 Patient reports pain as mild, pain controlled. No events throughout the night. Having some nausea and dizziness, which she attributes to the amount of analgesic medication.  She hasn't been up with PT, but looking forward to working well with them.  Ready to be discharge home. Dorsiflexion/plantar flexion intact, incision: dressing C/D/I, no cellulitis present and compartment soft.   LABS  Basename    HGB     9.4  HCT     28.5    Discharge Exam: General appearance: alert, cooperative and no distress Extremities: Homans sign is negative, no sign of DVT, no edema, redness or tenderness in the calves or thighs and no ulcers, gangrene or trophic changes  Disposition: Home with follow up in 2 weeks   Follow-up Information    Shelda Pal, MD. Schedule an appointment as soon as possible for a visit in 2 week(s).   Specialty:  Orthopedic Surgery Contact information: [address] [place] [address] [phone]        [facility] Follow up.   Specialty:  Home Health Services Why:  home health physical therapy  Contact information: [address] [place] [address] [phone]           Discharge Instructions    Call MD / Call 911    Complete by:  As directed    If you experience chest pain or shortness of breath, CALL 911 and be transported to the hospital emergency room.  If you develope a fever above 101 F, pus (white drainage) or increased  drainage or redness at the wound, or calf pain, call your surgeon's office.   Change dressing    Complete by:  As directed    Maintain surgical dressing until follow up in the clinic. If the edges start to pull up, may reinforce with tape. If the dressing is no longer working, may remove and cover with gauze and tape, but must keep the area dry and clean.  Call with any questions or concerns.   Constipation Prevention    Complete by:  As directed    Drink plenty of fluids.  Prune juice may be helpful.  You may use a stool softener, such as Colace (over the counter) 100 mg twice a day.  Use MiraLax (over the counter) for constipation as needed.   Diet - low sodium heart healthy    Complete by:  As directed    Discharge instructions    Complete by:  As directed    Maintain surgical dressing until follow up in the clinic. If the edges start to pull up, may reinforce with tape. If the dressing is no longer working, may remove and cover with gauze and tape, but must keep the area dry and clean.  [facility]. Call with any questions or concerns.   Increase activity slowly as tolerated    Complete by:  As directed    Weight bearing as tolerated with assist device (walker, cane, etc) as directed, use it as long as suggested by your surgeon or therapist, typically at least 4-6 weeks.   TED hose    Complete by:  As directed    Use stockings (TED hose) for 2 weeks on both leg(s).  You may remove them at night for sleeping.      Allergies as of 01/26/2016   No Known Allergies     Medication List    STOP taking these medications   ALEVE PO   aspirin 81 MG tablet Replaced by:  aspirin 81 MG chewable tablet   naproxen sodium 220 MG tablet Commonly known as:  ANAPROX     TAKE these medications   aspirin 81 MG chewable tablet Chew 1 tablet (81 mg total) by mouth 2 (two) times daily. Take for 4 weeks, then resume regular dose. Replaces:  aspirin 81 MG tablet    atorvastatin 10 MG tablet Commonly known as:  LIPITOR TAKE 1 TABLET BY MOUTH EVERY DAY (NEED PHYSICAL AND FASTING LABS) What changed:  See the new instructions.   b complex vitamins tablet Take 1 tablet by mouth daily.   CALCIUM-VITAMIN D PO Take 1 tablet by mouth daily.   docusate sodium 100 MG capsule Commonly known as:  COLACE Take 1 capsule (100 mg total) by mouth 2 (two) times daily.   ferrous sulfate 325 (65 FE) MG tablet Take 1 tablet (325 mg total) by mouth 3 (three) times daily after meals.   fish oil-omega-3 fatty acids 1000 MG capsule Take 1 g by mouth daily.   folic acid 1 MG tablet Commonly known as:  FOLVITE Take 1 tablet (1 mg total) by mouth daily.   HYDROcodone-acetaminophen 7.5-325 MG tablet Commonly known as:  NORCO Take 1-2 tablets by mouth every 4 (four) hours as needed for moderate pain.   methocarbamol 500 MG tablet Commonly known as:  ROBAXIN Take 1 tablet (500 mg total) by mouth every 6 (six) hours as needed for muscle spasms.   ondansetron 4 MG tablet Commonly known as:  ZOFRAN Take 1 tablet (4 mg total) by mouth every 6 (six) hours as needed for nausea or vomiting.   polyethylene glycol packet Commonly known as:  MIRALAX / GLYCOLAX Take 17 g by mouth 2 (two) times daily.        Signed: Anastasio Auerbach. Adline Kirshenbaum   PA-C  01/31/2016, 12:33 PM

## 2016-04-27 ENCOUNTER — Encounter: Payer: Self-pay | Admitting: Physical Therapy

## 2016-04-27 ENCOUNTER — Ambulatory Visit: Payer: Managed Care, Other (non HMO) | Attending: Urology | Admitting: Physical Therapy

## 2016-04-27 DIAGNOSIS — M6281 Muscle weakness (generalized): Secondary | ICD-10-CM | POA: Diagnosis not present

## 2016-04-27 DIAGNOSIS — R279 Unspecified lack of coordination: Secondary | ICD-10-CM | POA: Insufficient documentation

## 2016-04-27 DIAGNOSIS — M62838 Other muscle spasm: Secondary | ICD-10-CM | POA: Insufficient documentation

## 2016-04-27 NOTE — Therapy (Signed)
[facility] [address] [place], [place], [address] Phone: [phone]   Fax:  [phone]  Physical Therapy Evaluation  Patient Details  Name: Lynn Odonnell MRN: [id number] Date of Birth: 1960-05-28 Referring Provider: Crist Fat, MD  Encounter Date: 04/27/2016      PT End of Session - 04/27/16 1023    Visit Number 1   Date for PT Re-Evaluation 06/22/16   PT Start Time 0932   PT Stop Time 1020   PT Time Calculation (min) 48 min   Activity Tolerance Patient tolerated treatment well   Behavior During Therapy [facility] for tasks assessed/performed      Past Medical History:  Diagnosis Date  . Anxiety   . Arthritis    osteoarthritis-right hip, tendonitis -heels.  . Asymptomatic gallstones   . History of kidney stones    x 1 episode-lithotripsy.  Marland Kitchen Hyperlipidemia   . Increased homocysteine (HCC)    mother has history of this-not pt personally."makes prone to clot formation"  . Urolithiasis    history x1 episode-resolved    Past Surgical History:  Procedure Laterality Date  . APPENDECTOMY     open 5'03  . DILATION AND CURETTAGE OF UTERUS     3'93  . EXTRACORPOREAL SHOCK WAVE LITHOTRIPSY     x1   . PELVIC LAPAROSCOPY     endometriosis  . TOTAL HIP ARTHROPLASTY Right 01/25/2016   Procedure: RIGHT TOTAL HIP ARTHROPLASTY ANTERIOR APPROACH;  Surgeon: Durene Romans, MD;  Location: [facility];  Service: Orthopedics;  Laterality: Right;    There were no vitals filed for this visit.       Subjective Assessment - 04/27/16 0930    Subjective Pt is driving a lot and is controlling bladder by not drinking liquids fro about 11am to 5pm.  A couple cups of coffee then going a lot after that.  Will have to go when getting close to a toilet.  Will leak sometimes if coughing really hard.  Gets up 1x/night   Pertinent History THA, appendectomy   Limitations House hold activities   Patient Stated Goals be able to go  about my normal day and exercise for weight management   Currently in Pain? 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   Leisure playing tennis and jogging     Cognition   Overall Cognitive Status Within Functional Limits for tasks assessed     Observation/Other Assessments   Focus on Therapeutic Outcomes (FOTO)  urinary survey 42% limitation; bowel constipation survey 53% limited     Posture/Postural Control   Posture/Postural Control Postural limitations   Postural Limitations Rounded Shoulders     AROM   Overall AROM Comments hip internal rotatoin 25% limited     Strength   Right Hip External Rotation  4/5   Right Hip Internal Rotation 4/5   Left Hip ABduction 4/5   Left Hip ADduction 4/5  Flexibility   Soft Tissue Assessment /Muscle Length --  hip flexor tension     Palpation   Palpation comment laxity in lower abdominals, tight psoas bilaterally, no adhesions palpating external pelvic floor muscles     Ambulation/Gait   Gait Pattern Within Functional Limits                 Pelvic Floor Special Questions - 04/28/16 0001    Are you Pregnant or attempting pregnancy? Yes   Number of Pregnancies 3   Number of Vaginal Deliveries 2  1 miscarriage   Episiotomy Performed Yes  one large one   Currently Sexually Active Yes   Is this Painful No   Urinary Leakage Yes   How often very infrequent   Activities that cause leaking Coughing;Exercising;Running  haven't been exercising lately   Urinary urgency  Yes  going close to a toilet   Urinary frequency 2-3x in morning first 3 hours   Fecal incontinence --  constipated go 2x week   Fluid intake big water bottle when playing tennis, 3-4 glasses water otherwise   Caffeine beverages 2x/week   Falling out feeling (prolapse) No          [facility] Adult PT Treatment/Exercise - 04/28/16 0001      Self-Care   Self-Care Other Self-Care Comments   Other Self-Care Comments  urge to void and abdominal massage                PT Education - 04/27/16 1023    Education provided Yes   Education Details urge to void and abdominal massage   Person(s) Educated Patient   Methods Explanation;Demonstration;Tactile cues;Verbal cues;Handout   Comprehension Verbalized understanding;Returned demonstration          PT Short Term Goals - 04/27/16 1048      PT SHORT TERM GOAL #1   Title pt will demonstrate MMT bilateral hip 5/5 for improved stability and control during self care activities   Time 4   Period Weeks   Status New     PT SHORT TERM GOAL #2   Title pt will be able to perform abdominal massage and toileting techniques for improved bowel function and reports bowel movement at least every other day   Time 4   Period Weeks   Status New     PT SHORT TERM GOAL #3   Title pt will be independent with initial HEP   Time 4   Period Weeks   Status New           PT Long Term Goals - 04/27/16 1044      PT LONG TERM GOAL #1   Title FOTO for urinary problem survey < or = to 33% limitation   Baseline 42% limted   Time 8   Period Weeks   Status New     PT LONG TERM GOAL #2   Title pt will be able to demonstrate pelvic floor contraction and hold for 10 sec in order to control urge to void   Time 8   Period Weeks   Status New     PT LONG TERM GOAL #3   Title Pt will report no nocturia 50% of the time   Time 4   Period Weeks   Status New     PT LONG TERM GOAL #4   Title Pt will be able to demonstrate abdominal brace without  holding her breathing during functional activities such as side stepping as needed in returning  to sports   Time 8   Period Weeks   Status New     PT LONG TERM GOAL #5   Title pt will be confidnet to return to sports such as jogging nad tennis without urge to void or leakage   Time 8   Period Weeks   Status New               Plan - 04/27/16 1026    Clinical Impression Statement Pt was seen in clnic due to overactive bladder.  She also reports having increasing constipation sometimes not going 8 days when traveling.  Pt is not able to exercise or drink fluids in a healthy way due to her inability to control urge to void and need to get to bathroom quickly.  Pt is lacks muscle coordination for contracting abdominals without holding her breath.  Pt demonstrates LE weakness in bilateral hips from 4/5.  She goes 1x every night and several times after drinking coffee.  She is unable to hold bladder as she approaches the rest room.  Pt has muscle spasms and fascial adhesions in rectus abdominis, diaphragm, hip flexors bilaterally. Pt will benefit from skilled PT to address strength, cordination and soft tissue impairments inorder to return to active lifestyle and improved function. Pt is moderate complexity due to history of abdominal surgery, THA 01/25/16, chronicity of bladder urgency 20+ years and needing multiple regions assessed as well as condition worsening.   Rehab Potential Excellent   Clinical Impairments Affecting Rehab Potential history of abdominal surgery, THA 01/25/16, chronicity of bladder urgency 20+ years   PT Frequency 2x / week   PT Duration 8 weeks   PT Treatment/Interventions ADLs/Self Care Home Management;Biofeedback;Cryotherapy;Electrical Stimulation;Moist Heat;Ultrasound;Traction;Gait training;Stair training;Functional mobility training;Therapeutic activities;Therapeutic exercise;Balance training;Neuromuscular re-education;Patient/family education;Manual techniques;Manual  lymph drainage;Scar mobilization;Passive range of motion;Dry needling;Taping   PT Next Visit Plan review abdominal massage, toileting techniques, internal pelvic floor assessment if tolerate   Recommended Other Services none   Consulted and Agree with Plan of Care Patient      Patient will benefit from skilled therapeutic intervention in order to improve the following deficits and impairments:  Decreased strength, Decreased coordination, Increased muscle spasms  Visit Diagnosis: Muscle weakness (generalized)  Other muscle spasm  Unspecified lack of coordination     Problem List Patient Active Problem List   Diagnosis Date Noted  . Overweight (BMI 25.0-29.9) 01/26/2016  . S/P right THA, AA 01/25/2016  . Chest pain, atypical 11/29/2011  . Coagulopathy-- saw hematology 09-2010, w/u (-) except for heterozygite for MTHFR 09/22/2010  . HYPERLIPIDEMIA 11/19/2009  . ANXIETY 11/19/2009  . HOMOCYSTINEMIA 04/29/2008    Vincente Poli, PT 04/28/2016, 8:36 AM  [facility] [address] [place], [place], [address] Phone: [phone]   Fax:  [phone]  Name: Lynn Odonnell MRN: [id number] Date of Birth: 12-13-1960

## 2016-04-27 NOTE — Patient Instructions (Addendum)
Relaxation Exercises with the Urge to Void   When you experience an urge to void:  FIRST  Stop and stand very still    Sit down if you can    Don't move    You need to stay very still to maintain control  SECOND Squeeze your pelvic floor muscles 5 times, like a quick flick, to keep from leaking  THIRD Relax  Take a deep breath and then let it out  Try to make the urge go away by using relaxation and visualization techniques  FINALLY When you feel the urge go away somewhat, walk normally to the bathroom.   If the urge gets suddenly stronger on the way, you may stop again and relax to regain control.   About Abdominal Massage  Abdominal massage, also called external colon massage, is a self-treatment circular massage technique that can reduce and eliminate gas and ease constipation. The colon naturally contracts in waves in a clockwise direction starting from inside the right hip, moving up toward the ribs, across the belly, and down inside the left hip.  When you perform circular abdominal massage, you help stimulate your colon's normal wave pattern of movement called peristalsis.  It is most beneficial when done after eating.  Positioning You can practice abdominal massage with oil while lying down, or in the shower with soap.  Some people find that it is just as effective to do the massage through clothing while sitting or standing.  How to Massage Start by placing your finger tips or knuckles on your right side, just inside your hip bone.  . Make small circular movements while you move upward toward your rib cage.   . Once you reach the bottom right side of your rib cage, take your circular movements across to the left side of the bottom of your rib cage.  . Next, move downward until you reach the inside of your left hip bone.  This is the path your feces travel in your colon. . Continue to perform your abdominal massage in this pattern for 10 minutes each day.     You can  apply as much pressure as is comfortable in your massage.  Start gently and build pressure as you continue to practice.  Notice any areas of pain as you massage; areas of slight pain may be relieved as you massage, but if you have areas of significant or intense pain, consult with your healthcare provider.  Other Considerations . General physical activity including bending and stretching can have a beneficial massage-like effect on the colon.  Deep breathing can also stimulate the colon because breathing deeply activates the same nervous system that supplies the colon.   . Abdominal massage should always be used in combination with a bowel-conscious diet that is high in the proper type of fiber for you, fluids (primarily water), and a regular exercise program.  

## 2016-05-01 ENCOUNTER — Ambulatory Visit: Payer: Managed Care, Other (non HMO) | Admitting: Physical Therapy

## 2016-05-01 DIAGNOSIS — M62838 Other muscle spasm: Secondary | ICD-10-CM

## 2016-05-01 DIAGNOSIS — R279 Unspecified lack of coordination: Secondary | ICD-10-CM

## 2016-05-01 DIAGNOSIS — M6281 Muscle weakness (generalized): Secondary | ICD-10-CM

## 2016-05-01 NOTE — Patient Instructions (Signed)
  Start with 1tsp or less, then stick with that amount for one week Increase 1 tsp until you get up to 1 table spoon as tolerated     BACK: Child's Pose (Sciatica)    Sit in knee-chest position and reach arms forward. Separate knees for comfort. Hold position for _20__ seconds. Repeat _3__ times. Do _2__ times per day.  Copyright  VHI. All rights reserved.   Piriformis Stretch    Lying on back, pull right knee toward opposite shoulder. Hold _30___ seconds. Repeat __2__ times. Do _1___ sessions per day.  http://[email or web address].exer.us/258   Copyright  VHI. All rights reserved.   Piriformis Stretch, Supine    Lie supine, one ankle crossed onto opposite knee. Holding bottom leg behind knee, gently pull legs toward chest until stretch is felt in buttock of top leg. Hold _30__ seconds. For deeper stretch gently push top knee away from body.  Repeat _2__ times per session. Do _2__ sessions per day.  Copyright  VHI. All rights reserved.    Supine Knee-to-Chest, Unilateral    Lie on back, hands clasped behind one knee. Pull knee in toward chest until a comfortable stretch is felt in lower back and buttocks. Hold 30___ seconds.  Repeat __2_ times per session. Do _1__ sessions per day.  Copyright  VHI. All rights reserved.  Supine With Rotation    Lie on back with one knee drawn toward chest. Slowly bring bent leg across body until stretch is felt in lower back area. Hold _30__ seconds. Repeat to other side. Repeat _2__ times per session. Do __2_ sessions per day.  Copyright  VHI. All rights reserved.  Butterfly, Supine    Lie on back, feet together. Lower knees toward floor. Hold 60___ seconds. Repeat _2__ times per session. Do _1__ sessions per day.  Copyright  VHI. All rights reserved.

## 2016-05-01 NOTE — Therapy (Signed)
[facility] [address] [place], [place], [address] Phone: [phone]   Fax:  [phone]  Physical Therapy Treatment  Patient Details  Name: Lynn Odonnell MRN: [id number] Date of Birth: 1960/02/09 Referring Provider: Crist Fat, MD  Encounter Date: 05/01/2016      PT End of Session - 05/01/16 0935    Visit Number 2   Date for PT Re-Evaluation 06/22/16   PT Start Time 0930   PT Stop Time 1018   PT Time Calculation (min) 48 min   Activity Tolerance Patient tolerated treatment well   Behavior During Therapy [facility] for tasks assessed/performed      Past Medical History:  Diagnosis Date  . Anxiety   . Arthritis    osteoarthritis-right hip, tendonitis -heels.  . Asymptomatic gallstones   . History of kidney stones    x 1 episode-lithotripsy.  Marland Kitchen Hyperlipidemia   . Increased homocysteine (HCC)    mother has history of this-not pt personally."makes prone to clot formation"  . Urolithiasis    history x1 episode-resolved    Past Surgical History:  Procedure Laterality Date  . APPENDECTOMY     open 5'03  . DILATION AND CURETTAGE OF UTERUS     3'93  . EXTRACORPOREAL SHOCK WAVE LITHOTRIPSY     x1   . PELVIC LAPAROSCOPY     endometriosis  . TOTAL HIP ARTHROPLASTY Right 01/25/2016   Procedure: RIGHT TOTAL HIP ARTHROPLASTY ANTERIOR APPROACH;  Surgeon: Durene Romans, MD;  Location: [facility];  Service: Orthopedics;  Laterality: Right;    There were no vitals filed for this visit.      Subjective Assessment - 05/01/16 0935    Subjective Did not have the urgency since first visit, but it seems to come in clumps.     Pertinent History THA, appendectomy   Limitations House hold activities   Patient Stated Goals be able to go about my normal day and exercise for weight management   Currently in Pain? No/denies                      Pelvic Floor Special Questions - 05/01/16 0001    Skin  Integrity Intact   Perineal Body/Introitus  Descended   Prolapse None   Pelvic Floor Internal Exam pt informed and consent given to perform internal assessment   Exam Type Vaginal   Strength weak squeeze, no lift   Strength # of reps 3   Strength # of seconds 3   Tone low tone           [facility] Adult PT Treatment/Exercise - 05/01/16 0001      Lumbar Exercises: Stretches   Double Knee to Chest Stretch 3 reps;20 seconds  child's pose   Piriformis Stretch 3 reps;30 seconds  figure 4 and cross body     Lumbar Exercises: Supine   Ab Set 5 reps;5 seconds   Clam 20 reps  red band   Bent Knee Raise 20 reps     Lumbar Exercises: Quadruped   Other Quadruped Lumbar Exercises pelvic and abdominal bracing in quadruped - 10x 3 sec hold     Knee/Hip Exercises: Supine   Straight Leg Raises Strengthening;Right;Left;15 reps                PT Education - 05/01/16 1033    Education provided Yes   Education Details stretches and psyllium husk   Person(s) Educated Patient   Methods Explanation;Demonstration;Verbal cues;Handout;Tactile  cues   Comprehension Verbalized understanding;Returned demonstration          PT Short Term Goals - 05/01/16 1435      PT SHORT TERM GOAL #1   Title pt will demonstrate MMT bilateral hip 5/5 for improved stability and control during self care activities   Time 4   Period Weeks   Status On-going     PT SHORT TERM GOAL #2   Title pt will be able to perform abdominal massage and toileting techniques for improved bowel function and reports bowel movement at least every other day   Baseline report she has been doing these techniques and having BM every other day   Time 4   Period Weeks   Status Achieved     PT SHORT TERM GOAL #3   Title pt will be independent with initial HEP   Time 4   Period Weeks   Status On-going           PT Long Term Goals - 04/27/16 1044      PT LONG TERM GOAL #1   Title FOTO for urinary problem survey < or  = to 33% limitation   Baseline 42% limted   Time 8   Period Weeks   Status New     PT LONG TERM GOAL #2   Title pt will be able to demonstrate pelvic floor contraction and hold for 10 sec in order to control urge to void   Time 8   Period Weeks   Status New     PT LONG TERM GOAL #3   Title Pt will report no nocturia 50% of the time   Time 4   Period Weeks   Status New     PT LONG TERM GOAL #4   Title Pt will be able to demonstrate abdominal brace without holding her breathing during functional activities such as side stepping as needed in returning to sports   Time 8   Period Weeks   Status New     PT LONG TERM GOAL #5   Title pt will be confidnet to return to sports such as jogging nad tennis without urge to void or leakage   Time 8   Period Weeks   Status New               Plan - 05/01/16 4401    Clinical Impression Statement Pt having improved bowel function and states she has had less irritation this week.  Pt still demonstrates weakness with pelvic floor assessment.  Only first visit since eval and will need to continue skilled PT in order to improve coordination and endurance of abdominal and pelvic floor muscles for self care activities.   Rehab Potential Excellent   Clinical Impairments Affecting Rehab Potential history of abdominal surgery, THA 01/25/16, chronicity of bladder urgency 20+ years   PT Treatment/Interventions ADLs/Self Care Home Management;Biofeedback;Cryotherapy;Electrical Stimulation;Moist Heat;Ultrasound;Traction;Gait training;Stair training;Functional mobility training;Therapeutic activities;Therapeutic exercise;Balance training;Neuromuscular re-education;Patient/family education;Manual techniques;Manual lymph drainage;Scar mobilization;Passive range of motion;Dry needling;Taping   PT Next Visit Plan review stretches and abdominal bracing, progress to strengthening in different positions as able   Consulted and Agree with Plan of Care Patient       Patient will benefit from skilled therapeutic intervention in order to improve the following deficits and impairments:  Decreased strength, Decreased coordination, Increased muscle spasms  Visit Diagnosis: Muscle weakness (generalized)  Other muscle spasm  Unspecified lack of coordination     Problem List Patient Active Problem List  Diagnosis Date Noted  . Overweight (BMI 25.0-29.9) 01/26/2016  . S/P right THA, AA 01/25/2016  . Chest pain, atypical 11/29/2011  . Coagulopathy-- saw hematology 09-2010, w/u (-) except for heterozygite for MTHFR 09/22/2010  . HYPERLIPIDEMIA 11/19/2009  . ANXIETY 11/19/2009  . HOMOCYSTINEMIA 04/29/2008    Vincente Poli, PT 05/01/2016, 2:38 PM  [facility] [address] [place], [place], [address] Phone: [phone]   Fax:  [phone]  Name: Lynn Odonnell MRN: [id number] Date of Birth: Jul 26, 1960

## 2016-05-04 ENCOUNTER — Encounter: Payer: Self-pay | Admitting: Physical Therapy

## 2016-05-04 ENCOUNTER — Ambulatory Visit: Payer: Managed Care, Other (non HMO) | Admitting: Physical Therapy

## 2016-05-04 DIAGNOSIS — M62838 Other muscle spasm: Secondary | ICD-10-CM

## 2016-05-04 DIAGNOSIS — M6281 Muscle weakness (generalized): Secondary | ICD-10-CM

## 2016-05-04 DIAGNOSIS — R279 Unspecified lack of coordination: Secondary | ICD-10-CM

## 2016-05-04 NOTE — Therapy (Signed)
[facility] [address] [place], [place], [address] Phone: [phone]   Fax:  [phone]  Physical Therapy Treatment  Patient Details  Name: Lynn Odonnell MRN: [id number] Date of Birth: July 25, 1960 Referring Provider: Crist Fat, MD  Encounter Date: 05/04/2016      PT End of Session - 05/04/16 1025    Visit Number 3   Date for PT Re-Evaluation 06/22/16   PT Start Time 1021   PT Stop Time 1102   PT Time Calculation (min) 41 min   Activity Tolerance Patient tolerated treatment well   Behavior During Therapy [facility] for tasks assessed/performed      Past Medical History:  Diagnosis Date  . Anxiety   . Arthritis    osteoarthritis-right hip, tendonitis -heels.  . Asymptomatic gallstones   . History of kidney stones    x 1 episode-lithotripsy.  Marland Kitchen Hyperlipidemia   . Increased homocysteine (HCC)    mother has history of this-not pt personally."makes prone to clot formation"  . Urolithiasis    history x1 episode-resolved    Past Surgical History:  Procedure Laterality Date  . APPENDECTOMY     open 5'03  . DILATION AND CURETTAGE OF UTERUS     3'93  . EXTRACORPOREAL SHOCK WAVE LITHOTRIPSY     x1   . PELVIC LAPAROSCOPY     endometriosis  . TOTAL HIP ARTHROPLASTY Right 01/25/2016   Procedure: RIGHT TOTAL HIP ARTHROPLASTY ANTERIOR APPROACH;  Surgeon: Durene Romans, MD;  Location: [facility];  Service: Orthopedics;  Laterality: Right;    There were no vitals filed for this visit.      Subjective Assessment - 05/04/16 1115    Subjective I am having more of that feeling of discomfort and needing to pee since about an hour ago.   Pertinent History THA, appendectomy   Limitations House hold activities   Patient Stated Goals be able to go about my normal day and exercise for weight management   Currently in Pain? No/denies                         [facility] Adult PT Treatment/Exercise - 05/04/16  0001      Lumbar Exercises: Quadruped   Madcat/Old Horse 10 reps  with breathing to relax pelvic floor   Other Quadruped Lumbar Exercises child's pose and hip adductor stretch for pelvic floor relaxation     Manual Therapy   Manual Therapy Internal Pelvic Floor   Manual therapy comments pt was informed and consent given to treat pelvic floor internally   Internal Pelvic Floor left>right                PT Education - 05/04/16 1109    Education provided Yes   Education Details child pose, cow stretch, adductor stretch in sitting   Person(s) Educated Patient   Methods Explanation;Demonstration;Verbal cues;Handout   Comprehension Verbalized understanding;Returned demonstration          PT Short Term Goals - 05/01/16 1435      PT SHORT TERM GOAL #1   Title pt will demonstrate MMT bilateral hip 5/5 for improved stability and control during self care activities   Time 4   Period Weeks   Status On-going     PT SHORT TERM GOAL #2   Title pt will be able to perform abdominal massage and toileting techniques for improved bowel function and reports bowel movement at least every other day  Baseline report she has been doing these techniques and having BM every other day   Time 4   Period Weeks   Status Achieved     PT SHORT TERM GOAL #3   Title pt will be independent with initial HEP   Time 4   Period Weeks   Status On-going           PT Long Term Goals - 05/04/16 1026      PT LONG TERM GOAL #1   Title FOTO for urinary problem survey < or = to 33% limitation   Baseline 42% limted   Period Weeks   Status On-going     PT LONG TERM GOAL #2   Title pt will be able to demonstrate pelvic floor contraction and hold for 10 sec in order to control urge to void   Time 8   Period Weeks   Status On-going     PT LONG TERM GOAL #3   Title Pt will report no nocturia 50% of the time   Time 4   Period Weeks   Status On-going     PT LONG TERM GOAL #4   Title Pt will  be able to demonstrate abdominal brace without holding her breathing during functional activities such as side stepping as needed in returning to sports   Time 8   Period Weeks   Status On-going               Plan - 05/04/16 1026    Clinical Impression Statement Pt responded well to treatment and reports feeling better after today's session. Pt understood education on stretches and able to demonstrate correctly.  Pt had increased tightness in posterior pelvic floor especially on left obdurator internus and levator ani muscles.  Continues to need skilled PT for improving strength and muscle length in order to have improved self care and return to full activiites.   Rehab Potential Excellent   Clinical Impairments Affecting Rehab Potential history of abdominal surgery, THA 01/25/16, chronicity of bladder urgency 20+ years   PT Treatment/Interventions ADLs/Self Care Home Management;Biofeedback;Cryotherapy;Electrical Stimulation;Moist Heat;Ultrasound;Traction;Gait training;Stair training;Functional mobility training;Therapeutic activities;Therapeutic exercise;Balance training;Neuromuscular re-education;Patient/family education;Manual techniques;Manual lymph drainage;Scar mobilization;Passive range of motion;Dry needling;Taping   Consulted and Agree with Plan of Care Patient      Patient will benefit from skilled therapeutic intervention in order to improve the following deficits and impairments:  Decreased strength, Decreased coordination, Increased muscle spasms  Visit Diagnosis: Muscle weakness (generalized)  Other muscle spasm  Unspecified lack of coordination     Problem List Patient Active Problem List   Diagnosis Date Noted  . Overweight (BMI 25.0-29.9) 01/26/2016  . S/P right THA, AA 01/25/2016  . Chest pain, atypical 11/29/2011  . Coagulopathy-- saw hematology 09-2010, w/u (-) except for heterozygite for MTHFR 09/22/2010  . HYPERLIPIDEMIA 11/19/2009  . ANXIETY 11/19/2009   . HOMOCYSTINEMIA 04/29/2008    Vincente Poli, PT 05/04/2016, 11:18 AM  [facility] [address], [address] [place], [place], [address] Phone: [phone]   Fax:  [phone]  Name: Lynn Odonnell MRN: [id number] Date of Birth: Jan 31, 1960

## 2016-05-04 NOTE — Patient Instructions (Signed)
   Hold position for five breaths and relax and spreading the hips     Hold position for 3 breaths then curl tailbone under - repeat 5 x    Hold position to feel a stretch in inner thigh

## 2016-05-08 ENCOUNTER — Ambulatory Visit: Payer: Managed Care, Other (non HMO) | Admitting: Physical Therapy

## 2016-05-08 DIAGNOSIS — R279 Unspecified lack of coordination: Secondary | ICD-10-CM

## 2016-05-08 DIAGNOSIS — M62838 Other muscle spasm: Secondary | ICD-10-CM

## 2016-05-08 DIAGNOSIS — M6281 Muscle weakness (generalized): Secondary | ICD-10-CM

## 2016-05-08 NOTE — Patient Instructions (Signed)
Balloon Breath    Place hands LIGHTLY on belly below navel. Imagine a balloon inside belly. Blow up balloon on breath IN, deflate balloon on breath OUT. Contract abdominals slightly to assist breath OUT. Make sure you feel ribcage collapsing on the breath out. Time _1__ minutes.  Copyright  VHI. All rights reserved.     Holding band, take a step to the side keeping core engaged  Moisturizers . They are used in the vagina to hydrate the mucous membrane that make up the vaginal canal. . Designed to keep a more normal acid balance (ph) . Once placed in the vagina, it will last between two to three days.  . Use 2-3 times per week at bedtime and last longer than 60 min. . Ingredients to avoid is glycerin and fragrance, can increase chance of infection . Should not be used just before sex due to causing irritation . Most are gels administered either in a tampon-shaped applicator or as a vaginal suppository. They are non-hormonal.   Types of Moisturizers . Replens- drug store . Leatrice Jewels- drug store . Vitamin E vaginal suppositories- Whole foods . Moist Again . Coconut oil- can break down condoms . Karlton Lemon . Desert Harvest Releveum- [employer] . V-magic - cream external for the vulva area . Yes moisturizer- amazon  Things to avoid in the vaginal area . Do not use things to irritate the vulvar area . No lotions just specialized creams for the vulva area- Neogyn, V-magic, [employer] Vital V [employer] . No soaps; can use Aveeno or Calendula cleanser if needed. Must be gentle . No deodorants . No douches . Good to sleep without underwear to let the vaginal area to air out . No scrubbing: spread the lips to let warm water rinse over labias and pat dry

## 2016-05-08 NOTE — Therapy (Signed)
[facility] [address], [place], [place], [address] Phone: [phone]   Fax:  [phone]  Physical Therapy Treatment  Patient Details  Name: Lynn Odonnell MRN: [id number] Date of Birth: December 25, 1960 Referring Provider: Ardis Hughs, MD  Encounter Date: 05/08/2016      PT End of Session - 05/08/16 1637    Visit Number 4   Date for PT Re-Evaluation 06/22/16   PT Start Time 1017   PT Stop Time 1101   PT Time Calculation (min) 44 min   Activity Tolerance Patient tolerated treatment well   Behavior During Therapy [facility] for tasks assessed/performed      Past Medical History:  Diagnosis Date  . Anxiety   . Arthritis    osteoarthritis-right hip, tendonitis -heels.  . Asymptomatic gallstones   . History of kidney stones    x 1 episode-lithotripsy.  Marland Kitchen Hyperlipidemia   . Increased homocysteine (HCC)    mother has history of this-not pt personally."makes prone to clot formation"  . Urolithiasis    history x1 episode-resolved    Past Surgical History:  Procedure Laterality Date  . APPENDECTOMY     open 5'03  . DILATION AND CURETTAGE OF UTERUS     3'93  . EXTRACORPOREAL SHOCK WAVE LITHOTRIPSY     x1   . PELVIC LAPAROSCOPY     endometriosis  . TOTAL HIP ARTHROPLASTY Right 01/25/2016   Procedure: RIGHT TOTAL HIP ARTHROPLASTY ANTERIOR APPROACH;  Surgeon: Paralee Cancel, MD;  Location: [facility];  Service: Orthopedics;  Laterality: Right;    There were no vitals filed for this visit.      Subjective Assessment - 05/08/16 1041    Subjective I am not going to the bathroom as much.  I hit some tennis balls and no issues with that.  I have been having a bowel movement almost once per day.   Pertinent History Right THA, appendectomy   Limitations House hold activities   Patient Stated Goals be able to go about my normal day and exercise for weight management   Currently in Pain? No/denies                          [facility] Adult PT Treatment/Exercise - 05/08/16 0001      Lumbar Exercises: Supine   Ab Set 5 reps;5 seconds   Clam 20 reps  red band   Bent Knee Raise 20 reps   Straight Leg Raise 15 reps   Large Ball Abdominal Isometric 15 reps;5 seconds  press and overhead     Lumbar Exercises: Sidelying   Hip Abduction 15 reps   Other Sidelying Lumbar Exercises hip adduction - 10 reps     Lumbar Exercises: Prone   Other Prone Lumbar Exercises hip extension - 15 reps     Manual Therapy   Manual Therapy Myofascial release   Myofascial Release abdominal urogenital diaphragm and thoracic diaphragm                PT Education - 05/08/16 1449    Education provided Yes   Education Details oblique isometric standing, balloon breathing, moisturizers   Person(s) Educated Patient   Methods Explanation;Demonstration;Tactile cues;Verbal cues;Handout   Comprehension Verbalized understanding;Returned demonstration          PT Short Term Goals - 05/08/16 1450      PT SHORT TERM GOAL #1   Title pt will demonstrate MMT bilateral hip 5/5 for improved  stability and control during self care activities   Time 4   Period Weeks   Status On-going     PT SHORT TERM GOAL #3   Title pt will be independent with initial HEP   Time 4   Period Weeks   Status Achieved           PT Long Term Goals - 05/04/16 1026      PT LONG TERM GOAL #1   Title FOTO for urinary problem survey < or = to 33% limitation   Baseline 42% limted   Period Weeks   Status On-going     PT LONG TERM GOAL #2   Title pt will be able to demonstrate pelvic floor contraction and hold for 10 sec in order to control urge to void   Time 8   Period Weeks   Status On-going     PT LONG TERM GOAL #3   Title Pt will report no nocturia 50% of the time   Time 4   Period Weeks   Status On-going     PT LONG TERM GOAL #4   Title Pt will be able to demonstrate abdominal brace without  holding her breathing during functional activities such as side stepping as needed in returning to sports   Time 8   Period Weeks   Status On-going               Plan - 05/08/16 1451    Clinical Impression Statement Patient met goal for initial HEP . She continues to have LE weakness and difficulty with coordinating muscles for correct alignemnt with movement.  She continues to need skilled PT for strengthening and endurance for improved bladder and bowel control.   Rehab Potential Excellent   Clinical Impairments Affecting Rehab Potential history of abdominal surgery, THA 01/25/16, chronicity of bladder urgency 20+ years   PT Treatment/Interventions ADLs/Self Care Home Management;Biofeedback;Cryotherapy;Electrical Stimulation;Moist Heat;Ultrasound;Traction;Gait training;Stair training;Functional mobility training;Therapeutic activities;Therapeutic exercise;Balance training;Neuromuscular re-education;Patient/family education;Manual techniques;Manual lymph drainage;Scar mobilization;Passive range of motion;Dry needling;Taping   PT Next Visit Plan abdominal bracing, core and hip strength, sit to stand with contraction   Consulted and Agree with Plan of Care Patient      Patient will benefit from skilled therapeutic intervention in order to improve the following deficits and impairments:  Decreased strength, Decreased coordination, Increased muscle spasms  Visit Diagnosis: Muscle weakness (generalized)  Other muscle spasm  Unspecified lack of coordination     Problem List Patient Active Problem List   Diagnosis Date Noted  . Overweight (BMI 25.0-29.9) 01/26/2016  . S/P right THA, AA 01/25/2016  . Chest pain, atypical 11/29/2011  . Coagulopathy-- saw hematology 09-2010, w/u (-) except for heterozygite for MTHFR 09/22/2010  . HYPERLIPIDEMIA 11/19/2009  . ANXIETY 11/19/2009  . HOMOCYSTINEMIA 04/29/2008    Zannie Cove, PT 05/08/2016, 4:44 PM  [facility] [address], [place], [place], [address] Phone: [phone]   Fax:  [phone]  Name: Lynn Odonnell MRN: [id number] Date of Birth: [birth year]

## 2016-05-10 ENCOUNTER — Ambulatory Visit: Payer: Managed Care, Other (non HMO) | Admitting: Physical Therapy

## 2016-05-10 DIAGNOSIS — M6281 Muscle weakness (generalized): Secondary | ICD-10-CM

## 2016-05-10 DIAGNOSIS — M62838 Other muscle spasm: Secondary | ICD-10-CM

## 2016-05-10 DIAGNOSIS — R279 Unspecified lack of coordination: Secondary | ICD-10-CM

## 2016-05-10 NOTE — Therapy (Signed)
[facility] [address] [place], [place], [address] Phone: [phone]   Fax:  [phone]  Physical Therapy Treatment  Patient Details  Name: Lynn Odonnell MRN: [id number] Date of Birth: 07-28-1960 Referring Provider: Crist Fat, MD  Encounter Date: 05/10/2016      PT End of Session - 05/10/16 0935    Visit Number 5   Date for PT Re-Evaluation 06/22/16   PT Start Time 0933   PT Stop Time 1015   PT Time Calculation (min) 42 min   Activity Tolerance Patient tolerated treatment well   Behavior During Therapy [facility] for tasks assessed/performed      Past Medical History:  Diagnosis Date  . Anxiety   . Arthritis    osteoarthritis-right hip, tendonitis -heels.  . Asymptomatic gallstones   . History of kidney stones    x 1 episode-lithotripsy.  Marland Kitchen Hyperlipidemia   . Increased homocysteine (HCC)    mother has history of this-not pt personally."makes prone to clot formation"  . Urolithiasis    history x1 episode-resolved    Past Surgical History:  Procedure Laterality Date  . APPENDECTOMY     open 5'03  . DILATION AND CURETTAGE OF UTERUS     3'93  . EXTRACORPOREAL SHOCK WAVE LITHOTRIPSY     x1   . PELVIC LAPAROSCOPY     endometriosis  . TOTAL HIP ARTHROPLASTY Right 01/25/2016   Procedure: RIGHT TOTAL HIP ARTHROPLASTY ANTERIOR APPROACH;  Surgeon: Durene Romans, MD;  Location: [facility];  Service: Orthopedics;  Laterality: Right;    There were no vitals filed for this visit.      Subjective Assessment - 05/10/16 0934    Subjective I am not having as much urge and not worried about not getting to the bathroom   Pertinent History Right THA, appendectomy   Limitations House hold activities   Patient Stated Goals be able to go about my normal day and exercise for weight management   Currently in Pain? No/denies                         [facility] Adult PT Treatment/Exercise - 05/10/16 0001       Neuro Re-ed    Neuro Re-ed Details  all exercises with cues for abdominal engagement and correct alignment, sitting on physioball and lying on foam roller with UE and LE movments     Lumbar Exercises: Standing   Other Standing Lumbar Exercises side step with red band for isometric trunk rotation 15 x each side     Lumbar Exercises: Seated   Long Arc Quad on [place] Strengthening;Both;15 reps;Weights  1.5#   LAQ on [place] Limitations seated on ball red band pull for trunk rotation     Lumbar Exercises: Supine   Bridge 10 reps;3 seconds  ball squeeze   Bridge Limitations 10 x single leg bridge both sides   Straight Leg Raise 15 reps  1.5#     Lumbar Exercises: Sidelying   Hip Abduction 15 reps  1.5#   Other Sidelying Lumbar Exercises hip adduction - 15 reps  1.5#     Knee/Hip Exercises: Seated   Long Arc Quad 20 reps;Strengthening  sitting on green ball   Marching Limitations 20x  sitting on green ball                  PT Short Term Goals - 05/08/16 1450      PT SHORT TERM  GOAL #1   Title pt will demonstrate MMT bilateral hip 5/5 for improved stability and control during self care activities   Time 4   Period Weeks   Status On-going     PT SHORT TERM GOAL #3   Title pt will be independent with initial HEP   Time 4   Period Weeks   Status Achieved           PT Long Term Goals - 05/04/16 1026      PT LONG TERM GOAL #1   Title FOTO for urinary problem survey < or = to 33% limitation   Baseline 42% limted   Period Weeks   Status On-going     PT LONG TERM GOAL #2   Title pt will be able to demonstrate pelvic floor contraction and hold for 10 sec in order to control urge to void   Time 8   Period Weeks   Status On-going     PT LONG TERM GOAL #3   Title Pt will report no nocturia 50% of the time   Time 4   Period Weeks   Status On-going     PT LONG TERM GOAL #4   Title Pt will be able to demonstrate abdominal brace without holding her  breathing during functional activities such as side stepping as needed in returning to sports   Time 8   Period Weeks   Status On-going               Plan - 05/10/16 0940    Clinical Impression Statement Patient able to progress core strength, continue to have no urinary leakage or urgency since last visit.  Pt needs verbal and tactile cues to activate core and work on imbalances in hip strength.  Skilled PT needed to progress and address impairments for immproved bladder control and return to sports.   Rehab Potential Excellent   Clinical Impairments Affecting Rehab Potential history of abdominal surgery, THA 01/25/16, chronicity of bladder urgency 20+ years   PT Treatment/Interventions ADLs/Self Care Home Management;Biofeedback;Cryotherapy;Electrical Stimulation;Moist Heat;Ultrasound;Traction;Gait training;Stair training;Functional mobility training;Therapeutic activities;Therapeutic exercise;Balance training;Neuromuscular re-education;Patient/family education;Manual techniques;Manual lymph drainage;Scar mobilization;Passive range of motion;Dry needling;Taping   PT Next Visit Plan abdominal bracing, core and hip strength, sit to stand with contraction   Consulted and Agree with Plan of Care Patient      Patient will benefit from skilled therapeutic intervention in order to improve the following deficits and impairments:  Decreased strength, Decreased coordination, Increased muscle spasms  Visit Diagnosis: Muscle weakness (generalized)  Other muscle spasm  Unspecified lack of coordination     Problem List Patient Active Problem List   Diagnosis Date Noted  . Overweight (BMI 25.0-29.9) 01/26/2016  . S/P right THA, AA 01/25/2016  . Chest pain, atypical 11/29/2011  . Coagulopathy-- saw hematology 09-2010, w/u (-) except for heterozygite for MTHFR 09/22/2010  . HYPERLIPIDEMIA 11/19/2009  . ANXIETY 11/19/2009  . HOMOCYSTINEMIA 04/29/2008    Vincente Poli, PT 05/10/2016, 11:45  AM  [facility] [address] [place], [place], [address] Phone: [phone]   Fax:  [phone]  Name: Lynn Odonnell MRN: [id number] Date of Birth: [birth year]

## 2016-05-15 ENCOUNTER — Ambulatory Visit: Payer: Managed Care, Other (non HMO) | Admitting: Physical Therapy

## 2016-05-15 DIAGNOSIS — M6281 Muscle weakness (generalized): Secondary | ICD-10-CM

## 2016-05-15 DIAGNOSIS — R279 Unspecified lack of coordination: Secondary | ICD-10-CM

## 2016-05-15 DIAGNOSIS — M62838 Other muscle spasm: Secondary | ICD-10-CM

## 2016-05-15 NOTE — Patient Instructions (Signed)
   Holding band:  Arms out to the side - 20 reps Arms out to diagonal - 20 reps  Legs: Drop knee to the side and brace using lower abdominals to brace Marching leg up one at a time

## 2016-05-15 NOTE — Therapy (Signed)
[facility] [address] [place], [place], [address] Phone: [phone]   Fax:  [phone]  Physical Therapy Treatment  Patient Details  Name: Lynn Odonnell MRN: [id number] Date of Birth: Jul 11, 1960 Referring Provider: Crist Fat, MD  Encounter Date: 05/15/2016      PT End of Session - 05/15/16 1031    Visit Number 6   Date for PT Re-Evaluation 06/22/16   PT Start Time 1021   PT Stop Time 1103   PT Time Calculation (min) 42 min   Activity Tolerance Patient tolerated treatment well   Behavior During Therapy [facility] for tasks assessed/performed      Past Medical History:  Diagnosis Date  . Anxiety   . Arthritis    osteoarthritis-right hip, tendonitis -heels.  . Asymptomatic gallstones   . History of kidney stones    x 1 episode-lithotripsy.  Marland Kitchen Hyperlipidemia   . Increased homocysteine (HCC)    mother has history of this-not pt personally."makes prone to clot formation"  . Urolithiasis    history x1 episode-resolved    Past Surgical History:  Procedure Laterality Date  . APPENDECTOMY     open 5'03  . DILATION AND CURETTAGE OF UTERUS     3'93  . EXTRACORPOREAL SHOCK WAVE LITHOTRIPSY     x1   . PELVIC LAPAROSCOPY     endometriosis  . TOTAL HIP ARTHROPLASTY Right 01/25/2016   Procedure: RIGHT TOTAL HIP ARTHROPLASTY ANTERIOR APPROACH;  Surgeon: Durene Romans, MD;  [facility];  Service: Orthopedics;  Laterality: Right;    There were no vitals filed for this visit.      Subjective Assessment - 05/15/16 1034    Subjective I have still been having BM at least every other day   Currently in Pain? No/denies                         [facility] Adult PT Treatment/Exercise - 05/15/16 0001      Lumbar Exercises: Seated   LAQ on Ball Limitations seated on ball tactile cues for pelvic floor contract and bulge     Lumbar Exercises: Supine   Other Supine Lumbar Exercises on foam  roller, hip abduction, UE abduction and D2 ext red band - 20x each   Other Supine Lumbar Exercises foam roller under pelvic LE marching and hip rolls     Knee/Hip Exercises: Standing   Hip ADduction --   Hip Abduction Stengthening;Both;15 reps;Knee straight  on foam mat   Hip Extension Stengthening;Both;Knee straight;15 reps  on foam mat   Lateral Step Up Right;Left;10 reps  BOSU   Functional Squat 10 reps  on BOSU, standing and balance on BOSU                PT Education - 05/15/16 1106    Education provided Yes   Education Details exercises on foam roller   Person(s) Educated Patient   Methods Explanation;Handout;Demonstration;Tactile cues;Verbal cues   Comprehension Verbalized understanding;Returned demonstration          PT Short Term Goals - 05/15/16 1223      PT SHORT TERM GOAL #1   Title pt will demonstrate MMT bilateral hip 5/5 for improved stability and control during self care activities   Time 4   Period Weeks   Status On-going           PT Long Term Goals - 05/15/16 1223      PT LONG  TERM GOAL #2   Title pt will be able to demonstrate pelvic floor contraction and hold for 10 sec in order to control urge to void   Time 8   Period Weeks   Status On-going               Plan - 05/15/16 1033    Clinical Impression Statement Patient continues to be able to progress strength and stability exercises.  She was able to feel pelvic floor better when sitting on ball for tactile feedback. She needs tactile and verbal cues for alignment and for abdominal bracing   Clinical Impairments Affecting Rehab Potential history of abdominal surgery, THA 01/25/16, chronicity of bladder urgency 20+ years   PT Treatment/Interventions ADLs/Self Care Home Management;Biofeedback;Cryotherapy;Electrical Stimulation;Moist Heat;Ultrasound;Traction;Gait training;Stair training;Functional mobility training;Therapeutic activities;Therapeutic exercise;Balance  training;Neuromuscular re-education;Patient/family education;Manual techniques;Manual lymph drainage;Scar mobilization;Passive range of motion;Dry needling;Taping   PT Next Visit Plan core and hip strength, foam roller with pelvic lift, standing stability   Consulted and Agree with Plan of Care Patient      Patient will benefit from skilled therapeutic intervention in order to improve the following deficits and impairments:  Decreased strength, Decreased coordination, Increased muscle spasms  Visit Diagnosis: Muscle weakness (generalized)  Other muscle spasm  Unspecified lack of coordination     Problem List Patient Active Problem List   Diagnosis Date Noted  . Overweight (BMI 25.0-29.9) 01/26/2016  . S/P right THA, AA 01/25/2016  . Chest pain, atypical 11/29/2011  . Coagulopathy-- saw hematology 09-2010, w/u (-) except for heterozygite for MTHFR 09/22/2010  . HYPERLIPIDEMIA 11/19/2009  . ANXIETY 11/19/2009  . HOMOCYSTINEMIA 04/29/2008    Vincente Poli, PT 05/15/2016, 3:11 PM  [facility] [address] [place], [place], [address] Phone: [phone]   Fax:  [phone]  Name: Lynn Odonnell MRN: [id number] Date of Birth: 20-Dec-1960

## 2016-05-18 ENCOUNTER — Ambulatory Visit: Payer: Managed Care, Other (non HMO) | Attending: Urology | Admitting: Physical Therapy

## 2016-05-18 ENCOUNTER — Encounter: Payer: Self-pay | Admitting: Physical Therapy

## 2016-05-18 DIAGNOSIS — R279 Unspecified lack of coordination: Secondary | ICD-10-CM | POA: Insufficient documentation

## 2016-05-18 DIAGNOSIS — M6281 Muscle weakness (generalized): Secondary | ICD-10-CM | POA: Diagnosis present

## 2016-05-18 DIAGNOSIS — M62838 Other muscle spasm: Secondary | ICD-10-CM | POA: Diagnosis present

## 2016-05-18 NOTE — Therapy (Signed)
[facility] [address], [address] [place], [place], [address] Phone: [phone]   Fax:  [phone]  Physical Therapy Treatment  Patient Details  Name: NAIDELIN GUGLIOTTA MRN: [id number] Date of Birth: Nov 14, 1960 Referring Provider: Crist Fat, MD  Encounter Date: 05/18/2016      PT End of Session - 05/18/16 0851    Visit Number 7   Date for PT Re-Evaluation 06/22/16   PT Start Time 0841   PT Stop Time 0922   PT Time Calculation (min) 41 min   Activity Tolerance Patient tolerated treatment well   Behavior During Therapy [facility] for tasks assessed/performed      Past Medical History:  Diagnosis Date  . Anxiety   . Arthritis    osteoarthritis-right hip, tendonitis -heels.  . Asymptomatic gallstones   . History of kidney stones    x 1 episode-lithotripsy.  Marland Kitchen Hyperlipidemia   . Increased homocysteine (HCC)    mother has history of this-not pt personally."makes prone to clot formation"  . Urolithiasis    history x1 episode-resolved    Past Surgical History:  Procedure Laterality Date  . APPENDECTOMY     open 5'03  . DILATION AND CURETTAGE OF UTERUS     3'93  . EXTRACORPOREAL SHOCK WAVE LITHOTRIPSY     x1   . PELVIC LAPAROSCOPY     endometriosis  . TOTAL HIP ARTHROPLASTY Right 01/25/2016   Procedure: RIGHT TOTAL HIP ARTHROPLASTY ANTERIOR APPROACH;  Surgeon: Durene Romans, MD;  Location: [facility];  Service: Orthopedics;  Laterality: Right;    There were no vitals filed for this visit.      Subjective Assessment - 05/18/16 0946    Subjective I am doing welll, I am wondering how much longer I will need to do this.  I am still taking that pill to reduce urgency.   Pertinent History Right THA, appendectomy   Limitations House hold activities   Patient Stated Goals be able to go about my normal day and exercise for weight management   Currently in Pain? No/denies                         [facility]  Adult PT Treatment/Exercise - 05/18/16 0001      Neuro Re-ed    Neuro Re-ed Details  all exercises with pelvic floor and abdominal bracing     Lumbar Exercises: Standing   Other Standing Lumbar Exercises walking with sports cord 4x4 ways   Other Standing Lumbar Exercises kneeling and half kneeling weighted ball in different motions     Lumbar Exercises: Supine   Bridge Non-compliant;10 reps  single leg   Other Supine Lumbar Exercises on foam roller  horizontal adduction 2lb, UE flexion weighted ball, LE marching and adduction- 20x                  PT Short Term Goals - 05/15/16 1223      PT SHORT TERM GOAL #1   Title pt will demonstrate MMT bilateral hip 5/5 for improved stability and control during self care activities   Time 4   Period Weeks   Status On-going           PT Long Term Goals - 05/18/16 6962      PT LONG TERM GOAL #1   Title FOTO for urinary problem survey < or = to 33% limitation     PT LONG TERM GOAL #3   Title  Pt will report no nocturia 50% of the time   Baseline had a couple nights where I didn't wake up   Time 4   Period Weeks   Status On-going     PT LONG TERM GOAL #5   Title pt will be confidnet to return to sports such as jogging nad tennis without urge to void or leakage   Time 8   Period Weeks   Status Achieved               Plan - 05/18/16 [id number]    Clinical Impression Statement Patient making progress with having a couple nights without having to void at night.  Pt is still challenged with the exercises and needs cues to contract core.  Continues to have difficutly with muscle coordination.     Rehab Potential Excellent   Clinical Impairments Affecting Rehab Potential history of abdominal surgery, THA 01/25/16, chronicity of bladder urgency 20+ years   PT Treatment/Interventions ADLs/Self Care Home Management;Biofeedback;Cryotherapy;Electrical Stimulation;Moist Heat;Ultrasound;Traction;Gait training;Stair training;Functional  mobility training;Therapeutic activities;Therapeutic exercise;Balance training;Neuromuscular re-education;Patient/family education;Manual techniques;Manual lymph drainage;Scar mobilization;Passive range of motion;Dry needling;Taping   PT Next Visit Plan core and hip strength, foam roller with pelvic lift, standing stability   Consulted and Agree with Plan of Care Patient      Patient will benefit from skilled therapeutic intervention in order to improve the following deficits and impairments:  Decreased strength, Decreased coordination, Increased muscle spasms  Visit Diagnosis: No diagnosis found.     Problem List Patient Active Problem List   Diagnosis Date Noted  . Overweight (BMI 25.0-29.9) 01/26/2016  . S/P right THA, AA 01/25/2016  . Chest pain, atypical 11/29/2011  . Coagulopathy-- saw hematology 09-2010, w/u (-) except for heterozygite for MTHFR 09/22/2010  . HYPERLIPIDEMIA 11/19/2009  . ANXIETY 11/19/2009  . HOMOCYSTINEMIA 04/29/2008    Vincente PoliJakki Crosser, PT 05/18/2016, 9:47 AM  [facility] [address] [place], [place], [address] Phone: [phone]   Fax:  [phone]  Name: Sharmaine BaseJessica L Sasso MRN: [id number] Date of Birth: 05/04/1960

## 2016-05-22 ENCOUNTER — Ambulatory Visit: Payer: Managed Care, Other (non HMO) | Admitting: Physical Therapy

## 2016-05-22 ENCOUNTER — Encounter: Payer: Self-pay | Admitting: Physical Therapy

## 2016-05-22 DIAGNOSIS — R279 Unspecified lack of coordination: Secondary | ICD-10-CM

## 2016-05-22 DIAGNOSIS — M62838 Other muscle spasm: Secondary | ICD-10-CM

## 2016-05-22 DIAGNOSIS — M6281 Muscle weakness (generalized): Secondary | ICD-10-CM

## 2016-05-22 NOTE — Patient Instructions (Signed)
       Sliding leg to the side and back - repeat 10x do 2 sets  [facility] [address], [address] [place], [place] [address] Phone # [phone] Fax [phone]

## 2016-05-22 NOTE — Therapy (Addendum)
[facility] [address] [place], [place], [address] Phone: [phone]   Fax:  [phone]  Physical Therapy Treatment  Patient Details  Name: Lynn Odonnell MRN: [id number] Date of Birth: 1960-08-01 Referring Provider: Ardis Hughs, MD  Encounter Date: 05/22/2016      PT End of Session - 05/22/16 0935    Visit Number 8   Date for PT Re-Evaluation 06/22/16   PT Start Time 0930   PT Stop Time 3612   PT Time Calculation (min) 45 min   Activity Tolerance Patient tolerated treatment well   Behavior During Therapy [facility] for tasks assessed/performed      Past Medical History:  Diagnosis Date  . Anxiety   . Arthritis    osteoarthritis-right hip, tendonitis -heels.  . Asymptomatic gallstones   . History of kidney stones    x 1 episode-lithotripsy.  Marland Kitchen Hyperlipidemia   . Increased homocysteine (HCC)    mother has history of this-not pt personally."makes prone to clot formation"  . Urolithiasis    history x1 episode-resolved    Past Surgical History:  Procedure Laterality Date  . APPENDECTOMY     open 5'03  . DILATION AND CURETTAGE OF UTERUS     3'93  . EXTRACORPOREAL SHOCK WAVE LITHOTRIPSY     x1   . PELVIC LAPAROSCOPY     endometriosis  . TOTAL HIP ARTHROPLASTY Right 01/25/2016   Procedure: RIGHT TOTAL HIP ARTHROPLASTY ANTERIOR APPROACH;  Surgeon: Paralee Cancel, MD;  Location[facility];  Service: Orthopedics;  Laterality: Right;    There were no vitals filed for this visit.      Subjective Assessment - 05/22/16 0933    Subjective I have three more days with the medicine.   Pertinent History Right THA, appendectomy   Limitations House hold activities   Patient Stated Goals be able to go about my normal day and exercise for weight management   Currently in Pain? No/denies                         [facility] Adult PT Treatment/Exercise - 05/22/16 0001      Lumbar Exercises: Machines for  Strengthening   Leg Press bilateral 85 lb 15x; single leg 45 lb 10x + red band 10x     Lumbar Exercises: Standing   Other Standing Lumbar Exercises walking with sports cord 4x4 ways   Other Standing Lumbar Exercises --     Knee/Hip Exercises: Aerobic   Elliptical L1 x 6 min     Knee/Hip Exercises: Standing   Other Standing Knee Exercises ladder drills, side shuffle, in and out, hopping 10x each side (right a lot of internal hip rotatoin)   Other Standing Knee Exercises forward and side lunge on BOSU and heel slides - 15x each                PT Education - 05/22/16 1014    Education provided Yes   Education Details heel sliders   Person(s) Educated Patient   Methods Explanation;Demonstration;Verbal cues;Handout;Tactile cues   Comprehension Verbalized understanding;Returned demonstration          PT Short Term Goals - 05/15/16 1223      PT SHORT TERM GOAL #1   Title pt will demonstrate MMT bilateral hip 5/5 for improved stability and control during self care activities   Time 4   Period Weeks   Status On-going  PT Long Term Goals - 05/22/16 0955      PT LONG TERM GOAL #2   Title pt will be able to demonstrate pelvic floor contraction and hold for 10 sec in order to control urge to void   Time 8   Period Weeks   Status On-going     PT LONG TERM GOAL #3   Title Pt will report no nocturia 50% of the time   Baseline didn't wake last night   Time 4   Period Weeks   Status On-going     PT LONG TERM GOAL #4   Title Pt will be able to demonstrate abdominal brace without holding her breathing during functional activities such as side stepping as needed in returning to sports   Time 8   Period Weeks   Status On-going               Plan - 05/22/16 1004    Clinical Impression Statement Patient continues to demonstrate improved hip strength but still has weakness  and medial knee collapse during exercises but overall able to progress with  strengthening exercises.  She continues to need skilled PT in order to strengthen for returning to activity level.   Rehab Potential Excellent   Clinical Impairments Affecting Rehab Potential history of abdominal surgery, THA 01/25/16, chronicity of bladder urgency 20+ years   PT Treatment/Interventions ADLs/Self Care Home Management;Biofeedback;Cryotherapy;Electrical Stimulation;Moist Heat;Ultrasound;Traction;Gait training;Stair training;Functional mobility training;Therapeutic activities;Therapeutic exercise;Balance training;Neuromuscular re-education;Patient/family education;Manual techniques;Manual lymph drainage;Scar mobilization;Passive range of motion;Dry needling;Taping   PT Next Visit Plan core and hip strength, pelvic and abdominal bracing in all positions   Consulted and Agree with Plan of Care Patient      Patient will benefit from skilled therapeutic intervention in order to improve the following deficits and impairments:  Decreased strength, Decreased coordination, Increased muscle spasms  Visit Diagnosis: Muscle weakness (generalized)  Other muscle spasm  Unspecified lack of coordination     Problem List Patient Active Problem List   Diagnosis Date Noted  . Overweight (BMI 25.0-29.9) 01/26/2016  . S/P right THA, AA 01/25/2016  . Chest pain, atypical 11/29/2011  . Coagulopathy-- saw hematology 09-2010, w/u (-) except for heterozygite for MTHFR 09/22/2010  . HYPERLIPIDEMIA 11/19/2009  . ANXIETY 11/19/2009  . HOMOCYSTINEMIA 04/29/2008    Zannie Cove, PT 05/22/2016, 1:38 PM  [facility] [address], [place], [place], [address] Phone: [phone]   Fax:  [phone]   Name: Lynn Odonnell MRN: [id number] Date of Birth: August 23, 1960  PHYSICAL THERAPY DISCHARGE SUMMARY  Visits from Start of Care: 8  Current functional level related to goals / functional outcomes: See above goals   Remaining  deficits: See above   Education / Equipment: HEP  Plan: Patient agrees to discharge.  Patient goals were not met. Patient is being discharged due to financial reasons.  ?????         Google, PT 06/07/16 9:57 AM

## 2016-05-31 ENCOUNTER — Encounter: Payer: Managed Care, Other (non HMO) | Admitting: Physical Therapy

## 2016-06-01 ENCOUNTER — Encounter: Payer: Managed Care, Other (non HMO) | Admitting: Physical Therapy

## 2016-06-07 ENCOUNTER — Encounter: Payer: Managed Care, Other (non HMO) | Admitting: Physical Therapy

## 2016-06-14 ENCOUNTER — Encounter: Payer: Managed Care, Other (non HMO) | Admitting: Physical Therapy

## 2016-06-21 ENCOUNTER — Encounter: Payer: Managed Care, Other (non HMO) | Admitting: Physical Therapy

## 2016-09-07 NOTE — Addendum Note (Signed)
Addendum  created 09/07/16 0922 by Omayra Tulloch, MD   Sign clinical note    

## 2018-05-03 IMAGING — DX DG HIP (WITH OR WITHOUT PELVIS) 1V PORT*R*
2 series · 2 of 2 positions shown · non-contrast
Comparison: CT abdomen and pelvis 09/24/2012

CLINICAL DATA: Status post right hip replacement.

EXAM:
DG HIP (WITH OR WITHOUT PELVIS) 1V PORT RIGHT

[pelvis ap]
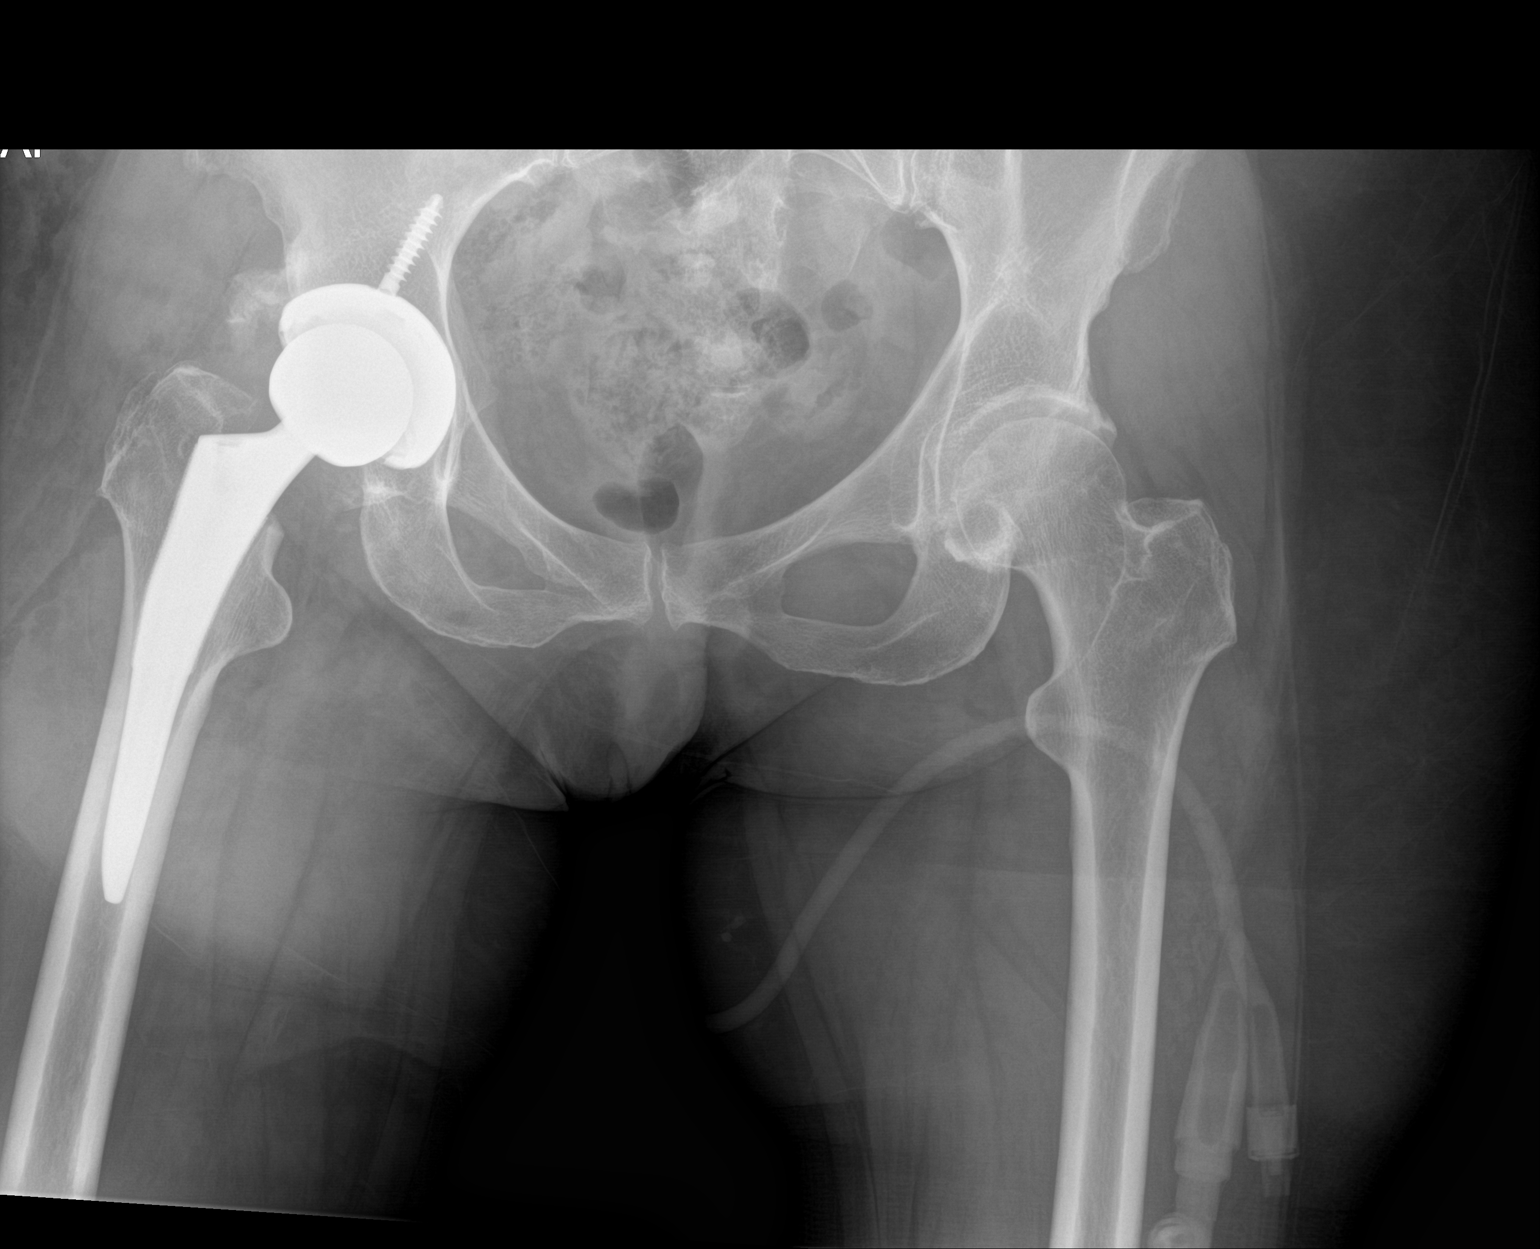

[hip ap]
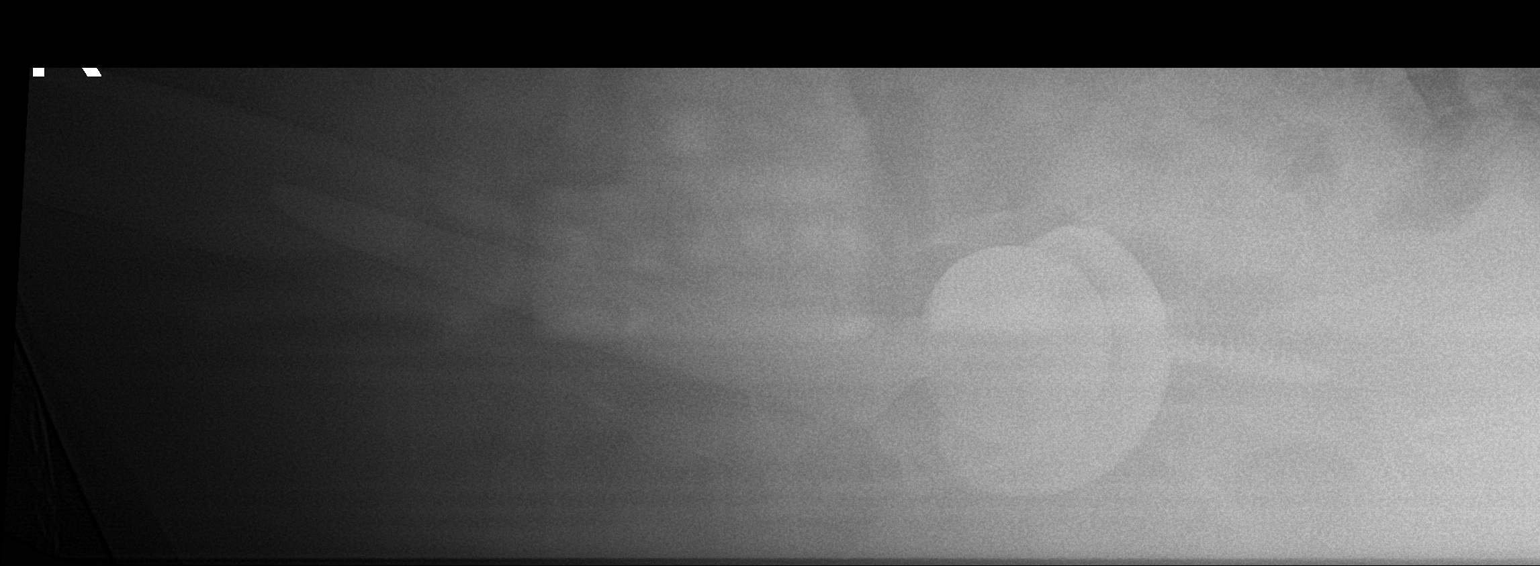

[2 of 2 positions shown; findings below may reference images not displayed]

FINDINGS: Sequelae of right total hip arthroplasty are identified. The
prosthetic acetabular and femoral components appear normally located
without evidence of dislocation or periprosthetic fracture. Limited
evaluation of the left hip demonstrates preserved joint space with
without evidence of acute fracture or dislocation on the single AP
pelvis image.
IMPRESSION: Right total hip arthroplasty without evidence of acute complication.

These results will be called to the ordering clinician or
representative by the Radiologist Assistant, and communication
documented in the PACS or zVision Dashboard.

## 2018-10-30 ENCOUNTER — Encounter: Payer: Self-pay | Admitting: Gastroenterology

## 2019-12-20 ENCOUNTER — Other Ambulatory Visit: Payer: Managed Care, Other (non HMO)

## 2019-12-20 DIAGNOSIS — Z20822 Contact with and (suspected) exposure to covid-19: Secondary | ICD-10-CM

## 2019-12-21 LAB — NOVEL CORONAVIRUS, NAA: SARS-CoV-2, NAA: DETECTED — AB

## 2019-12-21 LAB — SARS-COV-2, NAA 2 DAY TAT

## 2019-12-22 ENCOUNTER — Telehealth: Payer: Self-pay | Admitting: Nurse Practitioner

## 2019-12-22 NOTE — Telephone Encounter (Signed)
Called to Discuss with patient about Covid symptoms and the use of the monoclonal antibody infusion for those with mild to moderate Covid symptoms and at a high risk of hospitalization.     Pt appears to qualify for this infusion due to co-morbid conditions and/or a member of an at-risk group in accordance with the FDA Emergency Use Authorization. (BMI, anticoagulant, CAD, smoker).    Unable to reach pt. Voicemail left and My Chart message has been sent.   Willette Alma, NP [facility]  [phone]

## 2019-12-23 ENCOUNTER — Ambulatory Visit: Payer: Self-pay | Admitting: *Deleted

## 2019-12-23 NOTE — Telephone Encounter (Signed)
  Reason for Disposition . [1] COVID-19 diagnosed by positive lab test AND [2] mild symptoms (e.g., cough, fever, others) AND [3] no complications or SOB  Answer Assessment - Initial Assessment Questions 1. COVID-19 DIAGNOSIS: "Who made your Coronavirus (COVID-19) diagnosis?" "Was it confirmed by a positive lab test?" If not diagnosed by a HCP, ask "Are there lots of cases (community spread) where you live?" (See public health department website, if unsure)     Mobile unit 2. COVID-19 EXPOSURE: "Was there any known exposure to COVID before the symptoms began?" CDC Definition of close contact: within 6 feet (2 meters) for a total of 15 minutes or more over a 24-hour period.      Not sure 3. ONSET: "When did the COVID-19 symptoms start?"      Wednesday last week 4. WORST SYMPTOM: "What is your worst symptom?" (e.g., cough, fever, shortness of breath, muscle aches)     congestion 5. COUGH: "Do you have a cough?" If Yes, ask: "How bad is the cough?"       Yes- not that bad- dry cough 6. FEVER: "Do you have a fever?" If Yes, ask: "What is your temperature, how was it measured, and when did it start?"     Yes- but worst is over 7. RESPIRATORY STATUS: "Describe your breathing?" (e.g., shortness of breath, wheezing, unable to speak)      No problems breathing 8. BETTER-SAME-WORSE: "Are you getting better, staying the same or getting worse compared to yesterday?"  If getting worse, ask, "In what way?"     better 9. HIGH RISK DISEASE: "Do you have any chronic medical problems?" (e.g., asthma, heart or lung disease, weak immune system, obesity, etc.)     no 10. PREGNANCY: "Is there any chance you are pregnant?" "When was your last menstrual period?"       n/a 11. OTHER SYMPTOMS: "Do you have any other symptoms?"  (e.g., chills, fatigue, headache, loss of smell or taste, muscle pain, sore throat; new loss of smell or taste especially support the diagnosis of COVID-19)       Fatigue,  congestion  Protocols used: CORONAVIRUS (COVID-19) DIAGNOSED OR SUSPECTED-A-AH

## 2019-12-25 ENCOUNTER — Telehealth: Payer: Self-pay | Admitting: General Practice

## 2019-12-25 NOTE — Telephone Encounter (Signed)
Patient called the wrong number

## 2019-12-25 NOTE — Telephone Encounter (Signed)
I attempted to call the patient in response to her MyChart reply. Number rings straight to VM. Also attempted to call pt's husband without answer.

## 2019-12-26 ENCOUNTER — Telehealth (HOSPITAL_COMMUNITY): Payer: Self-pay | Admitting: Family

## 2019-12-26 ENCOUNTER — Ambulatory Visit: Payer: Self-pay | Admitting: *Deleted

## 2019-12-26 DIAGNOSIS — U071 COVID-19: Secondary | ICD-10-CM

## 2019-12-26 NOTE — Telephone Encounter (Signed)
Called to discuss with [employer] about Covid symptoms and potential candidacy for the use of sotrovimab, a combination monoclonal antibody infusion for those with mild to moderate Covid symptoms and at a high risk of hospitalization.     Pt is qualified for this infusion at the infusion center due to co-morbid conditions and/or a member of an at-risk group, however unable to reach patient. Phone rings straight to VM. Of note, a note was left today in epic that patient is interested in the infusion and would like to speak with the MAB team. Previous attempts to reach this patient have been made per documentation.   Emmory Solivan,NP

## 2019-12-26 NOTE — Telephone Encounter (Signed)
Congestion still present- cough still present. Patient did get call from infusion clinic- but did not get call back number. Advised to call back with information. Patient also advised to contact her PCP regarding cough and congestion- she has had this for over 1 week. Discussed neding isolation and criteria for that.  Reason for Disposition . [1] COVID-19 diagnosed by positive lab test AND [2] mild symptoms (e.g., cough, fever, others) AND [3] no complications or SOB  Protocols used: CORONAVIRUS (COVID-19) DIAGNOSED OR SUSPECTED-A-AH

## 2019-12-26 NOTE — Telephone Encounter (Signed)
Multiple attempts to reach this patient for MAB screening. Message received from patient on hotline to return call to this patient at an alternate phone number which is her husband who is her emergency contact's phone number. Called the number as requested  to discuss with [employer] about Covid symptoms and potential candidacy for the use of sotrovimab, a combination monoclonal antibody infusion for those with mild to moderate Covid symptoms and at a high risk of hospitalization.     Pt is qualified for this infusion at the infusion center due to co-morbid conditions and/or a member of an at-risk group, however unable to reach patient as after multiple rings husband's VM answered. VM left with hotline phone number.   Zarie Kosiba,NP
